# Patient Record
Sex: Male | Born: 1970
Health system: Southern US, Community
[De-identification: ages and names within clinical notes are randomized; demographics above are authoritative.]

## PROBLEM LIST (undated history)

## (undated) DIAGNOSIS — E7800 Pure hypercholesterolemia, unspecified: Secondary | ICD-10-CM

## (undated) DIAGNOSIS — T7840XA Allergy, unspecified, initial encounter: Secondary | ICD-10-CM

## (undated) DIAGNOSIS — R7303 Prediabetes: Secondary | ICD-10-CM

## (undated) DIAGNOSIS — J45909 Unspecified asthma, uncomplicated: Secondary | ICD-10-CM

## (undated) HISTORY — PX: EYE SURGERY: SHX253

## (undated) HISTORY — DX: Prediabetes: R73.03

## (undated) HISTORY — DX: Allergy, unspecified, initial encounter: T78.40XA

---

## 2016-08-14 ENCOUNTER — Emergency Department (HOSPITAL_BASED_OUTPATIENT_CLINIC_OR_DEPARTMENT_OTHER)
Admission: EM | Admit: 2016-08-14 | Discharge: 2016-08-14 | Disposition: A | Discharge status: Home / Self Care | Payer: Medicaid Other | Attending: Emergency Medicine | Admitting: Emergency Medicine

## 2016-08-14 ENCOUNTER — Encounter (HOSPITAL_BASED_OUTPATIENT_CLINIC_OR_DEPARTMENT_OTHER): Payer: Self-pay

## 2016-08-14 DIAGNOSIS — Y999 Unspecified external cause status: Secondary | ICD-10-CM | POA: Diagnosis not present

## 2016-08-14 DIAGNOSIS — Y939 Activity, unspecified: Secondary | ICD-10-CM | POA: Diagnosis not present

## 2016-08-14 DIAGNOSIS — S0502XA Injury of conjunctiva and corneal abrasion without foreign body, left eye, initial encounter: Secondary | ICD-10-CM | POA: Diagnosis not present

## 2016-08-14 DIAGNOSIS — Y929 Unspecified place or not applicable: Secondary | ICD-10-CM | POA: Insufficient documentation

## 2016-08-14 DIAGNOSIS — X58XXXA Exposure to other specified factors, initial encounter: Secondary | ICD-10-CM | POA: Insufficient documentation

## 2016-08-14 DIAGNOSIS — S0592XA Unspecified injury of left eye and orbit, initial encounter: Secondary | ICD-10-CM | POA: Diagnosis present

## 2016-08-14 MED ORDER — FLUORESCEIN SODIUM 1 MG OP STRP
ORAL_STRIP | OPHTHALMIC | Status: AC
  Administered 2016-08-14
  Filled 2016-08-14: qty 1

## 2016-08-14 MED ORDER — FLUORESCEIN SODIUM 1 MG OP STRP
1.0000 | ORAL_STRIP | Freq: Once | OPHTHALMIC | Status: AC
  Administered 2016-08-14: 1 via OPHTHALMIC

## 2016-08-14 MED ORDER — ERYTHROMYCIN 5 MG/GM OP OINT
TOPICAL_OINTMENT | Freq: Four times a day (QID) | OPHTHALMIC | Status: DC
  Administered 2016-08-14: 1 via OPHTHALMIC
  Filled 2016-08-14: qty 3.5

## 2016-08-14 MED ORDER — HYDROCODONE-ACETAMINOPHEN 5-325 MG PO TABS: 1.0000 | ORAL_TABLET | Freq: Four times a day (QID) | ORAL | 0 refills | Status: DC | PRN

## 2016-08-14 MED ORDER — TETRACAINE HCL 0.5 % OP SOLN
OPHTHALMIC | Status: AC
  Administered 2016-08-14
  Filled 2016-08-14: qty 4

## 2016-08-14 MED ORDER — HYDROCODONE-ACETAMINOPHEN 5-325 MG PO TABS
1.0000 | ORAL_TABLET | Freq: Once | ORAL | Status: AC
  Administered 2016-08-14: 1 via ORAL
  Filled 2016-08-14: qty 1

## 2016-08-14 MED ORDER — TETRACAINE HCL 0.5 % OP SOLN
2.0000 [drp] | Freq: Once | OPHTHALMIC | Status: AC
  Administered 2016-08-14: 2 [drp] via OPHTHALMIC

## 2016-08-14 NOTE — ED Provider Notes (Signed)
  [facility]   CSN: [id number] Arrival date & time: 08/14/16  2033  By signing my name below, [id number], Juan Willis, attest that this documentation has been prepared under the direction and in the presence of Juan LibraJohn Raju Coppolino, MD . Electronically Signed: Freida Busmaniana Willis, Scribe. 08/14/2016. 11:21 PM.  History   Chief Complaint Chief Complaint  Patient presents with  . Eye Problem    The history is provided by the patient. No language interpreter was used.     HPI Comments:  Juan Willis is a 45 y.o. male who presents to the Emergency Department complaining of left eye redness which began this AM. He reports associated burning pain and watering of the eye. He also notes mild vision change in the left eye. Pt believes he may have gotten something in his eye while cleaning but is unsure. He has used visine eye drops without relief.    History reviewed. No pertinent past medical history.  There are no active problems to display for this patient.   Past Surgical History:  Procedure Laterality Date  . EYE SURGERY       Home Medications    Prior to Admission medications   Medication Sig Start Date End Date Taking? Authorizing Provider  HYDROcodone-acetaminophen (NORCO) 5-325 MG tablet Take 1-2 tablets by mouth every 6 (six) hours as needed (for eye pain). 08/14/16   Juan LibraJohn Zachary Lovins, MD    Family History No family history on file.  Social History Social History  Substance Use Topics  . Smoking status: Never Smoker  . Smokeless tobacco: Never Used  . Alcohol use Yes     Comment: occ     Allergies   Review of patient's allergies indicates no known allergies.   Review of Systems Review of Systems 10 systems reviewed and all are negative for acute change except as noted in the HPI.   Physical Exam Updated Vital Signs BP 110/85 (BP Location: Right Arm)   Pulse 84   Temp 98.8 F (37.1 C) (Oral)   Resp 18   Ht 6\' 2"  (1.88 m)   Wt 180 lb (81.6 kg)    SpO2 99%   BMI 23.11 kg/m   Physical Exam General: Well-developed, well-nourished male in no acute distress; appearance consistent with age of record HENT: normocephalic; atraumatic Eyes: pupils equal, round and reactive to light; extraocular muscles intact; left conjunctival injection; small left corneal abrasion at ~11 o'clock  Neck: supple Heart: regular rate and rhythm Lungs: clear to auscultation bilaterally Abdomen: soft; nondistended; nontender; bowel sounds present Extremities: No deformity; full range of motion; pulses normal Neurologic: Awake, alert and oriented; motor function intact in all extremities and symmetric; no facial droop Skin: Warm and dry Psychiatric: Normal mood and affect   ED Treatments / Results  DIAGNOSTIC STUDIES:  Oxygen Saturation is 99% on RA, normal by my interpretation.    Procedures (including critical care time)   Final Clinical Impressions(s) / ED Diagnoses   Final diagnoses:  Corneal abrasion, left, initial encounter   I personally performed the services described in this documentation, which was scribed in my presence. The recorded information has been reviewed and is accurate.    Juan LibraJohn Laqueena Hinchey, MD 08/14/16 2329

## 2016-08-14 NOTE — ED Triage Notes (Signed)
Pt states he was dusting yesterday-no issues-this am after shower, redness/pain to left eye-NAD-steady gait

## 2016-10-11 ENCOUNTER — Ambulatory Visit (INDEPENDENT_AMBULATORY_CARE_PROVIDER_SITE_OTHER): Payer: Medicaid Other

## 2016-10-11 ENCOUNTER — Ambulatory Visit (HOSPITAL_COMMUNITY)
Admission: EM | Admit: 2016-10-11 | Discharge: 2016-10-11 | Disposition: A | Discharge status: Home / Self Care | Payer: Medicaid Other | Attending: Family Medicine | Admitting: Family Medicine

## 2016-10-11 ENCOUNTER — Encounter (HOSPITAL_COMMUNITY): Payer: Self-pay | Admitting: *Deleted

## 2016-10-11 DIAGNOSIS — M62838 Other muscle spasm: Secondary | ICD-10-CM

## 2016-10-11 MED ORDER — CYCLOBENZAPRINE HCL 5 MG PO TABS: 5.0000 mg | ORAL_TABLET | Freq: Every day | ORAL | 0 refills | Status: DC

## 2016-10-11 NOTE — ED Triage Notes (Signed)
Pt  Reports   Symptoms  Of  Neck  Pain   With  Onset     About  1  Year  Ago       Pt reports       Symptoms      Not  releived  By  ibuprophen        Pt  denys  Any  specefic  Injury

## 2016-10-11 NOTE — ED Provider Notes (Signed)
[facility]    CSN: [id number] Arrival date & time: 10/11/16  1253     History   Chief Complaint Chief Complaint  Patient presents with  . Neck Pain    HPI Juan Willis is a 45 y.o. male.   The history is provided by the patient.  Neck Pain  Pain location:  Occipital region Quality:  Shooting Pain radiates to:  Head Pain severity:  Mild Onset quality:  Gradual Duration:  12 months Progression:  Unchanged Chronicity:  Chronic Associated symptoms: headaches   Associated symptoms: no numbness, no paresis and no weakness     History reviewed. No pertinent past medical history.  There are no active problems to display for this patient.   Past Surgical History:  Procedure Laterality Date  . EYE SURGERY         Home Medications    Prior to Admission medications   Medication Sig Start Date End Date Taking? Authorizing Provider  HYDROcodone-acetaminophen (NORCO) 5-325 MG tablet Take 1-2 tablets by mouth every 6 (six) hours as needed (for eye pain). 08/14/16   Paula LibraJohn Molpus, MD    Family History History reviewed. No pertinent family history.  Social History Social History  Substance Use Topics  . Smoking status: Never Smoker  . Smokeless tobacco: Never Used  . Alcohol use Yes     Comment: occ     Allergies   Review of patient's allergies indicates no known allergies.   Review of Systems Review of Systems  Musculoskeletal: Positive for neck pain. Negative for neck stiffness.  Neurological: Positive for headaches. Negative for weakness and numbness.  Hematological: Negative for adenopathy.  All other systems reviewed and are negative.    Physical Exam Triage Vital Signs ED Triage Vitals [10/11/16 1313]  Enc Vitals Group     BP 120/64     Pulse Rate 72     Resp 14     Temp 97.6 F (36.4 C)     Temp Source Oral     SpO2 99 %     Weight      Height      Head Circumference      Peak Flow      Pain Score      Pain Loc      Pain  Edu?      Excl. in GC?    No data found.   Updated Vital Signs BP 120/64 (BP Location: Left Arm)   Pulse 72   Temp 97.6 F (36.4 C) (Oral)   Resp 14   SpO2 99%   Visual Acuity Right Eye Distance:   Left Eye Distance:   Bilateral Distance:    Right Eye Near:   Left Eye Near:    Bilateral Near:     Physical Exam  Constitutional: He is oriented to person, place, and time. He appears well-developed and well-nourished.  Neck: Normal range of motion. Neck supple.  Musculoskeletal: Normal range of motion. He exhibits no tenderness or deformity.  Lymphadenopathy:    He has no cervical adenopathy.  Neurological: He is alert and oriented to person, place, and time.  Skin: Skin is warm and dry.  Nursing note and vitals reviewed.    UC Treatments / Results  Labs (all labs ordered are listed, but only abnormal results are displayed) Labs Reviewed - No data to display  EKG  EKG Interpretation None       Radiology No results found. X-rays reviewed and report per radiologist.  Procedures Procedures (including critical care time)  Medications Ordered in UC Medications - No data to display   Initial Impression / Assessment and Plan / UC Course  I have reviewed the triage vital signs and the nursing notes.  Pertinent labs & imaging results that were available during my care of the patient were reviewed by me and considered in my medical decision making (see chart for details).  Clinical Course      Final Clinical Impressions(s) / UC Diagnoses   Final diagnoses:  None    New Prescriptions New Prescriptions   No medications on file     Linna Hoff, MD 10/11/16 1435

## 2017-04-10 ENCOUNTER — Encounter (HOSPITAL_BASED_OUTPATIENT_CLINIC_OR_DEPARTMENT_OTHER): Payer: Self-pay | Admitting: *Deleted

## 2017-04-10 ENCOUNTER — Emergency Department (HOSPITAL_BASED_OUTPATIENT_CLINIC_OR_DEPARTMENT_OTHER)
Admission: EM | Admit: 2017-04-10 | Discharge: 2017-04-10 | Disposition: A | Discharge status: Home / Self Care | Payer: Medicaid Other | Attending: Emergency Medicine | Admitting: Emergency Medicine

## 2017-04-10 DIAGNOSIS — M542 Cervicalgia: Secondary | ICD-10-CM | POA: Insufficient documentation

## 2017-04-10 MED ORDER — METHOCARBAMOL 500 MG PO TABS: 500.0000 mg | ORAL_TABLET | Freq: Two times a day (BID) | ORAL | 0 refills | Status: DC

## 2017-04-10 MED FILL — METHOCARBAMOL 500 MG TABLET: 500 | 10 days supply | Qty: 20 | Fill #0

## 2017-04-10 NOTE — ED Provider Notes (Signed)
[facility]   CSN: [id number] Arrival date & time: 04/10/17  4098     History   Chief Complaint Chief Complaint  Patient presents with  . Neck Pain    HPI Juan Willis is a 46 y.o. male.  Patient presents with a 14 month history of neck pain that began after he lifted a heavy object at home. He states that he has seen a specialist in [place] who gave him 2 steroid injections in his neck las year. He endorses relief with these but he states the pain has returned. He is here today because he is still having the same symptoms and ibuprofen 600 mg has not helped. He would like a referral to a specialist for more possible injections or imaging. Denies fevers, vomiting, rashes, trouble breathing, sick contacts, vision changes, recent injury.      History reviewed. No pertinent past medical history.  There are no active problems to display for this patient.   Past Surgical History:  Procedure Laterality Date  . EYE SURGERY         Home Medications    Prior to Admission medications   Medication Sig Start Date End Date Taking? Authorizing Provider  methocarbamol (ROBAXIN) 500 MG tablet Take 1 tablet (500 mg total) by mouth 2 (two) times daily. 04/10/17   Dietrich Pates, PA-C    Family History History reviewed. No pertinent family history.  Social History Social History  Substance Use Topics  . Smoking status: Never Smoker  . Smokeless tobacco: Never Used  . Alcohol use Yes     Comment: occ     Allergies   Patient has no known allergies.   Review of Systems Review of Systems  Constitutional: Negative for appetite change, chills and fever.  HENT: Negative for rhinorrhea and sore throat.   Eyes: Negative for photophobia and visual disturbance.  Respiratory: Negative for cough, chest tightness, shortness of breath and wheezing.   Cardiovascular: Negative for chest pain and palpitations.  Gastrointestinal: Negative for abdominal pain,  nausea and vomiting.  Musculoskeletal: Positive for neck pain. Negative for myalgias.  Skin: Negative for rash.  Neurological: Negative for dizziness, weakness and headaches.     Physical Exam Updated Vital Signs BP 118/76 (BP Location: Left Arm)   Pulse 93   Temp 98 F (36.7 C) (Oral)   Resp 16   Ht  (1.676 m)   Wt 73.9 kg   SpO2 100%   BMI 26.31 kg/m   Physical Exam  Constitutional: He appears well-developed and well-nourished. No distress.  HENT:  Head: Normocephalic and atraumatic.  Nose: Nose normal.  Eyes: Conjunctivae and EOM are normal. Right eye exhibits no discharge. Left eye exhibits no discharge. No scleral icterus.  Neck: Normal range of motion. Neck supple.  Patient has good active and passive range of motion of the neck. No step off noted. Negative Kernig and Brudzinski's signs.  Cardiovascular: Normal rate, regular rhythm, normal heart sounds and intact distal pulses.  Exam reveals no gallop and no friction rub.   No murmur heard. Pulmonary/Chest: Effort normal and breath sounds normal. No respiratory distress.  Abdominal: Soft. Bowel sounds are normal.  Musculoskeletal: Normal range of motion.  Lymphadenopathy:    He has no cervical adenopathy.  Neurological: He is alert. He exhibits normal muscle tone. Coordination normal.  Skin: Skin is warm and dry. No rash noted.  Psychiatric: He has a normal mood and affect.  Nursing note and vitals reviewed.  ED Treatments / Results  Labs (all labs ordered are listed, but only abnormal results are displayed) Labs Reviewed - No data to display  EKG  EKG Interpretation None       Radiology No results found.  Procedures Procedures (including critical care time)  Medications Ordered in ED Medications - No data to display   Initial Impression / Assessment and Plan / ED Course  I have reviewed the triage vital signs and the nursing notes.  Pertinent labs & imaging results that were available  during my care of the patient were reviewed by me and considered in my medical decision making (see chart for details).     Patient with a 14 month history of neck pain after lifting a heavy object. We will refer him to sports medicine and neurosurgery for possible imaging and injections as warranted. We'll also give him muscle relaxer to try because he has not tried this in the past. Return precautions given.  Final Clinical Impressions(s) / ED Diagnoses   Final diagnoses:  Neck pain    New Prescriptions New Prescriptions   METHOCARBAMOL (ROBAXIN) 500 MG TABLET    Take 1 tablet (500 mg total) by mouth 2 (two) times daily.     Dietrich Pates, PA-C 04/10/17 9528    Gwyneth Sprout, MD 04/10/17 [phone]

## 2017-04-10 NOTE — Discharge Instructions (Signed)
Begin taking Robaxin for muscle spasms. Scheduled appointment with Dr. Pearletha Forge in sports medicine or Dr. Conchita Paris with neurosurgery. Return to ED for worsening pain, fever, rash, vision changes, injuries, chest pain, trouble breathing.

## 2017-04-10 NOTE — ED Triage Notes (Signed)
Pt reports lifting a heavy object in June when he lived in [place] and had right shoulder pain. States he saw a doctor in [place] and had two cortisone injections which helped at the time but now his pain has returned. Pt states he has been taking motrin 800 but it is not helping. Pt requests referral to a "specialist."

## 2017-04-14 ENCOUNTER — Ambulatory Visit (INDEPENDENT_AMBULATORY_CARE_PROVIDER_SITE_OTHER): Payer: Medicaid Other | Admitting: Family Medicine

## 2017-04-14 ENCOUNTER — Encounter: Payer: Self-pay | Admitting: Family Medicine

## 2017-04-14 DIAGNOSIS — M542 Cervicalgia: Secondary | ICD-10-CM | POA: Diagnosis present

## 2017-04-14 MED ORDER — METHOCARBAMOL 500 MG PO TABS: 500.0000 mg | ORAL_TABLET | Freq: Three times a day (TID) | ORAL | 1 refills | Status: DC | PRN

## 2017-04-14 MED ORDER — MELOXICAM 15 MG PO TABS
15.0000 mg | ORAL_TABLET | Freq: Every day | ORAL | 2 refills | Status: DC
Start: 2017-04-14 — End: 2018-12-15

## 2017-04-14 MED FILL — METHOCARBAMOL 500 MG TABLET: 500 | 20 days supply | Qty: 60 | Fill #0

## 2017-04-14 MED FILL — MELOXICAM 15 MG TABLET: 15 | 30 days supply | Qty: 30 | Fill #0

## 2017-04-14 NOTE — Progress Notes (Signed)
PCP: No PCP Per Patient  Subjective:   HPI: Patient is a 46 y.o. male here for neck pain.  Patient reports a little over a year ago he recalled picking up and carrying something heavy on his left shoulder. No acute injury with this but the next day he could barely turn his neck. He was seen in [place] (where he lived at the time) and given ibuprofen, advised to change pillows. Has continued to have problems - went to an ED out there and given two injections (? In the neck) which helped a lot. Pain worsened again recently - went to ED downstairs and had x-rays that were normal, given robaxin which has helped him. Pain is at base of skull area now, neck pain feels better. Pain level 4/10, dull. No skin changes, numbness. No radiation into extremities. No bowel/bladder dysfunction.  No past medical history on file.  No current outpatient prescriptions on file prior to visit.   No current facility-administered medications on file prior to visit.     Past Surgical History:  Procedure Laterality Date  . EYE SURGERY      No Known Allergies  Social History   Social History  . Marital status: Married    Spouse name: N/A  . Number of children: N/A  . Years of education: N/A   Occupational History  . Not on file.   Social History Main Topics  . Smoking status: Never Smoker  . Smokeless tobacco: Never Used  . Alcohol use Yes     Comment: occ  . Drug use: No  . Sexual activity: Not on file   Other Topics Concern  . Not on file   Social History Narrative  . No narrative on file    No family history on file.  BP 116/81   Pulse 78   Ht  (1.676 m)   Wt 196 lb 9.6 oz (89.2 kg)   BMI 31.73 kg/m   Review of Systems: See HPI above.     Objective:  Physical Exam:  Gen: NAD, comfortable in exam room  Neck: No gross deformity, swelling, bruising. No TTP .  No midline/bony TTP. Extension only about 5 degrees, some pain .  FROM other motions without  pain. BUE strength 5/5.   Sensation intact to light touch.   1+ equal reflexes in triceps, biceps, brachioradialis tendons. Negative spurlings. NV intact distal BUEs.   Assessment & Plan:  1. Neck pain - consistent with cervical strain.  Independently reviewed radiographs and no abnormalities noted.  No evidence radiculopathy.  Start with meloxicam, robaxin if needed.  Handout provided with some home exercises and stretches to start.  Heat for spasms.  F/u in 1 month to 6 weeks.  Consider physical therapy, MRI depending on his status.

## 2017-04-14 NOTE — Patient Instructions (Signed)
You have a cervical strain at the base of your skull. Start meloxicam  daily with food. Take robaxin as needed as well for spasms. Consider physical therapy. See handout for stretches, exercises you can do daily. Heat as needed for spasms. Follow up with me in 1 month to 6 weeks. If not improving will consider an MRI.

## 2017-04-14 NOTE — Assessment & Plan Note (Signed)
consistent with cervical strain.  Independently reviewed radiographs and no abnormalities noted.  No evidence radiculopathy.  Start with meloxicam, robaxin if needed.  Handout provided with some home exercises and stretches to start.  Heat for spasms.  F/u in 1 month to 6 weeks.  Consider physical therapy, MRI depending on his status.

## 2017-05-21 ENCOUNTER — Ambulatory Visit: Payer: Medicaid Other | Admitting: Family Medicine

## 2017-05-27 ENCOUNTER — Ambulatory Visit: Payer: Medicaid Other | Admitting: Family Medicine

## 2017-12-24 ENCOUNTER — Ambulatory Visit: Payer: Self-pay | Admitting: Family Medicine

## 2018-05-30 ENCOUNTER — Emergency Department (HOSPITAL_BASED_OUTPATIENT_CLINIC_OR_DEPARTMENT_OTHER): Payer: Medicaid Other

## 2018-05-30 ENCOUNTER — Emergency Department (HOSPITAL_BASED_OUTPATIENT_CLINIC_OR_DEPARTMENT_OTHER)
Admission: EM | Admit: 2018-05-30 | Discharge: 2018-05-30 | Disposition: A | Discharge status: Home / Self Care | Payer: Medicaid Other | Attending: Emergency Medicine | Admitting: Emergency Medicine

## 2018-05-30 ENCOUNTER — Other Ambulatory Visit: Payer: Self-pay

## 2018-05-30 ENCOUNTER — Encounter (HOSPITAL_BASED_OUTPATIENT_CLINIC_OR_DEPARTMENT_OTHER): Payer: Self-pay | Admitting: Emergency Medicine

## 2018-05-30 DIAGNOSIS — R222 Localized swelling, mass and lump, trunk: Secondary | ICD-10-CM | POA: Insufficient documentation

## 2018-05-30 DIAGNOSIS — R0602 Shortness of breath: Secondary | ICD-10-CM | POA: Insufficient documentation

## 2018-05-30 DIAGNOSIS — Z79899 Other long term (current) drug therapy: Secondary | ICD-10-CM | POA: Diagnosis not present

## 2018-05-30 NOTE — ED Provider Notes (Signed)
[facility] EMERGENCY DEPARTMENT Provider Note   CSN: [id number] Arrival date & time: 05/30/18  2009     History   Chief Complaint Chief Complaint  Patient presents with  . bump in epigastric area    HPI Juan Willis is a 47 y.o. male.  Patient presents to the emergency department with swelling noted below the breastbone in the area of the xiphoid process.  He noticed this recently.  He was concerned about it and did not want to wait to try to get into a primary care doctor.  He denies any pain.  He states that sometimes he has difficulty breathing at night.  No fevers, nausea, vomiting.  No abdominal pain. The onset of this condition was acute. The course is constant. Aggravating factors: none. Alleviating factors: none.       History reviewed. No pertinent past medical history.  Patient Active Problem List   Diagnosis Date Noted  . Neck pain 04/14/2017    Past Surgical History:  Procedure Laterality Date  . EYE SURGERY          Home Medications    Prior to Admission medications   Medication Sig Start Date End Date Taking? Authorizing Provider  meloxicam (MOBIC) 15 MG tablet Take 1 tablet (15 mg total) by mouth daily. 04/14/17   Hudnall, Azucena FallenShane R, MD  methocarbamol (ROBAXIN) 500 MG tablet Take 1 tablet (500 mg total) by mouth every 8 (eight) hours as needed for muscle spasms. 04/14/17   Lenda KelpHudnall, Shane R, MD    Family History History reviewed. No pertinent family history.  Social History Social History   Tobacco Use  . Smoking status: Never Smoker  . Smokeless tobacco: Never Used  Substance Use Topics  . Alcohol use: Yes    Comment: occ  . Drug use: No     Allergies   Patient has no known allergies.   Review of Systems Review of Systems  Constitutional: Negative for chills and fever.  Respiratory: Positive for shortness of breath (at night, sometimes, no worse lately). Negative for cough.   Cardiovascular: Negative for chest pain.    Gastrointestinal: Negative for nausea and vomiting.     Physical Exam Updated Vital Signs BP (!) 141/91 (BP Location: Left Arm)   Pulse 78   Temp 98.6 F (37 C) (Oral)   Resp 18   Ht 5\' 6"  (1.676 m)   Wt 84 kg (185 lb 3 oz)   SpO2 98%   BMI 29.89 kg/m   Physical Exam  Constitutional: He appears well-developed and well-nourished.  HENT:  Head: Normocephalic and atraumatic.  Eyes: Conjunctivae are normal.  Neck: Normal range of motion. Neck supple.  Pulmonary/Chest: No respiratory distress. He exhibits no tenderness.  When patient pushes out his chest, there is a firm palpable visible nodule in the xiphoid area. No associated tenderness.   Neurological: He is alert.  Skin: Skin is warm and dry.  Psychiatric: He has a normal mood and affect.  Nursing note and vitals reviewed.    ED Treatments / Results  Labs (all labs ordered are listed, but only abnormal results are displayed) Labs Reviewed - No data to display  EKG None  Radiology No results found.  Procedures Procedures (including critical care time)  Medications Ordered in ED Medications - No data to display   Initial Impression / Assessment and Plan / ED Course  I have reviewed the triage vital signs and the nursing notes.  Pertinent labs & imaging results that  were available during my care of the patient were reviewed by me and considered in my medical decision making (see chart for details).     Patient seen and examined. X-ray ordered.   Vital signs reviewed and are as follows: BP (!) 141/91 (BP Location: Left Arm)   Pulse 78   Temp 98.6 F (37 C) (Oral)   Resp 18   Ht 5\' 6"  (1.676 m)   Wt 84 kg (185 lb 3 oz)   SpO2 98%   BMI 29.89 kg/m   Patient updated on results.  Bony skeleton appears unremarkable.  Patient has a natural outward deviation of his xiphoid process which may be why he is feeling a lump in this area.  Encourage follow-up with PCP as needed.  Final Clinical  Impressions(s) / ED Diagnoses   Final diagnoses:  Localized swelling of chest wall   No signs of soft tissue swelling or infection at area of swelling.  ED Discharge Orders    None       Renne Crigler, Cordelia Poche 05/30/18 2151    Maia Plan, MD 05/31/18 [phone]

## 2018-05-30 NOTE — ED Notes (Signed)
Pt points to xyphoid process and is concerned that it is protuberant. No abnormalities noted.

## 2018-05-30 NOTE — ED Triage Notes (Signed)
Patient states that he started to feel his lower sternal area today and he felt a bump - denies any pain

## 2018-05-30 NOTE — ED Notes (Signed)
No bump noted to epigastric area.  Wife stated that patient has difficulty breath at night and got awaken on his sleep.

## 2018-05-30 NOTE — Discharge Instructions (Signed)
Please read and follow all provided instructions.  Your diagnoses today include:  1. Localized swelling of chest wall     Tests performed today include:  Chest x-ray - is normal, your xiphoid process naturally turns out towards the skin and this may be why it feels more prominent.  Vital signs. See below for your results today.   Medications prescribed:   None  Take any prescribed medications only as directed.  Home care instructions:  Follow any educational materials contained in this packet.  BE VERY CAREFUL not to take multiple medicines containing Tylenol (also called acetaminophen). Doing so can lead to an overdose which can damage your liver and cause liver failure and possibly death.   Follow-up instructions: Please follow-up with your primary care provider as needed for further evaluation of your symptoms.   Return instructions:   Please return to the Emergency Department if you experience worsening symptoms.   Please return if you have any other emergent concerns.  Additional Information:  Your vital signs today were: BP (!) 141/91 (BP Location: Left Arm)    Pulse 78    Temp 98.6 F (37 C) (Oral)    Resp 18    Ht 5\' 6"  (1.676 m)    Wt 84 kg (185 lb 3 oz)    SpO2 98%    BMI 29.89 kg/m  If your blood pressure (BP) was elevated above 135/85 this visit, please have this repeated by your doctor within one month. --------------

## 2018-09-04 DIAGNOSIS — N401 Enlarged prostate with lower urinary tract symptoms: Secondary | ICD-10-CM | POA: Diagnosis not present

## 2018-09-04 DIAGNOSIS — R3912 Poor urinary stream: Secondary | ICD-10-CM | POA: Diagnosis not present

## 2018-09-04 DIAGNOSIS — R350 Frequency of micturition: Secondary | ICD-10-CM | POA: Diagnosis not present

## 2018-09-04 DIAGNOSIS — N403 Nodular prostate with lower urinary tract symptoms: Secondary | ICD-10-CM | POA: Diagnosis not present

## 2018-09-04 DIAGNOSIS — R3911 Hesitancy of micturition: Secondary | ICD-10-CM | POA: Diagnosis not present

## 2018-12-14 ENCOUNTER — Other Ambulatory Visit: Payer: Self-pay

## 2018-12-14 ENCOUNTER — Encounter (HOSPITAL_BASED_OUTPATIENT_CLINIC_OR_DEPARTMENT_OTHER): Payer: Self-pay | Admitting: Emergency Medicine

## 2018-12-14 ENCOUNTER — Emergency Department (HOSPITAL_BASED_OUTPATIENT_CLINIC_OR_DEPARTMENT_OTHER)
Admission: EM | Admit: 2018-12-14 | Discharge: 2018-12-15 | Disposition: A | Discharge status: Home / Self Care | Payer: Medicaid Other | Attending: Emergency Medicine | Admitting: Emergency Medicine

## 2018-12-14 DIAGNOSIS — Y9389 Activity, other specified: Secondary | ICD-10-CM | POA: Diagnosis not present

## 2018-12-14 DIAGNOSIS — T543X1A Toxic effect of corrosive alkalis and alkali-like substances, accidental (unintentional), initial encounter: Secondary | ICD-10-CM | POA: Insufficient documentation

## 2018-12-14 DIAGNOSIS — T2692XA Corrosion of left eye and adnexa, part unspecified, initial encounter: Secondary | ICD-10-CM | POA: Insufficient documentation

## 2018-12-14 DIAGNOSIS — S0592XA Unspecified injury of left eye and orbit, initial encounter: Secondary | ICD-10-CM | POA: Diagnosis present

## 2018-12-14 DIAGNOSIS — X58XXXA Exposure to other specified factors, initial encounter: Secondary | ICD-10-CM | POA: Insufficient documentation

## 2018-12-14 DIAGNOSIS — Y92009 Unspecified place in unspecified non-institutional (private) residence as the place of occurrence of the external cause: Secondary | ICD-10-CM | POA: Diagnosis not present

## 2018-12-14 DIAGNOSIS — Y998 Other external cause status: Secondary | ICD-10-CM | POA: Insufficient documentation

## 2018-12-14 MED ORDER — TETRACAINE HCL 0.5 % OP SOLN
2.0000 [drp] | Freq: Once | OPHTHALMIC | Status: AC
  Administered 2018-12-14: 2 [drp] via OPHTHALMIC
  Filled 2018-12-14: qty 4

## 2018-12-14 NOTE — ED Notes (Signed)
pH L eye 8.0 R eye 7.0

## 2018-12-14 NOTE — ED Triage Notes (Signed)
Pt states he had a clogged drain so he put some chemical and hot water in the drain then went downstairs and loosened the pipe and the water from the pipe got in his right eye  Pt states he flushed his eye and used eye drops

## 2018-12-15 MED ORDER — CIPROFLOXACIN HCL 0.3 % OP SOLN
2.0000 [drp] | OPHTHALMIC | Status: DC
  Administered 2018-12-15: 2 [drp] via OPHTHALMIC
  Filled 2018-12-15: qty 2.5

## 2018-12-15 MED ORDER — FLUORESCEIN SODIUM 1 MG OP STRP
1.0000 | ORAL_STRIP | Freq: Once | OPHTHALMIC | Status: AC
  Administered 2018-12-15: 1 via OPHTHALMIC
  Filled 2018-12-15: qty 1

## 2018-12-15 NOTE — ED Provider Notes (Signed)
[facility] Provider Note: Lowella DellJ. Lane Shantinique Picazo, MD, FACEP  CSN: [id number] MRN: [id number] ARRIVAL: 12/14/18 at 2257 ROOM: [facility]   CHIEF COMPLAINT  Eye Problem   HISTORY OF PRESENT ILLNESS  12/15/18 12:13 AM Juan Willis is a 47 y.o. male who was cleaning out a drain about 2 PM yesterday afternoon.  He got some drain cleaner in hot water sprayed in his left eye.  He flushed his left eye with water and use some Visine.  He presents with redness and pain in his left eye.  He rates his pain about a 5 out of 10, burning in nature.  His eyes were irrigated by nursing staff prior to my evaluation.  The vision in his left eye was blurred earlier but has improved.   History reviewed. No pertinent past medical history.  Past Surgical History:  Procedure Laterality Date  . EYE SURGERY      History reviewed. No pertinent family history.  Social History   Tobacco Use  . Smoking status: Never Smoker  . Smokeless tobacco: Never Used  Substance Use Topics  . Alcohol use: Yes    Comment: occ  . Drug use: No    Prior to Admission medications   Medication Sig Start Date End Date Taking? Authorizing Provider  meloxicam (MOBIC) 15 MG tablet Take 1 tablet (15 mg total) by mouth daily. 04/14/17   Hudnall, Azucena FallenShane R, MD  methocarbamol (ROBAXIN) 500 MG tablet Take 1 tablet (500 mg total) by mouth every 8 (eight) hours as needed for muscle spasms. 04/14/17   Lenda KelpHudnall, Shane R, MD    Allergies Patient has no known allergies.   REVIEW OF SYSTEMS  Negative except as noted here or in the History of Present Illness.   PHYSICAL EXAMINATION  Initial Vital Signs Blood pressure 132/86, pulse 86, temperature 97.6 F (36.4 C), temperature source Oral, resp. rate 16, height 5\' 7"  (1.702 m), weight 87.5 kg, SpO2 97 %.  Examination General: Well-developed, well-nourished male in no acute distress; appearance consistent with age of record HENT: normocephalic; mild left periorbital  erythema Eyes: pupils equal, round and reactive to light; extraocular muscles intact; left conjunctival erythema with slight mucoid discharge; no fluorescein uptake; left eye pH 7 Neck: supple Heart: regular rate and rhythm Lungs: clear to auscultation bilaterally Abdomen: soft; nondistended; nontender; bowel sounds present Extremities: No deformity; full range of motion Neurologic: Awake, alert and oriented; motor function intact in all extremities and symmetric; no facial droop Skin: Warm and dry Psychiatric: Normal mood and affect   RESULTS  Summary of this visit's results, reviewed by myself:   EKG Interpretation  Date/Time:    Ventricular Rate:    PR Interval:    QRS Duration:   QT Interval:    QTC Calculation:   R Axis:     Text Interpretation:        Laboratory Studies: No results found for this or any previous visit (from the past 24 hour(s)). Imaging Studies: No results found.  ED COURSE and MDM  Nursing notes and initial vitals signs, including pulse oximetry, reviewed.  Vitals:   12/14/18 2310 12/14/18 2312  BP: 132/86   Pulse: 86   Resp: 16   Temp: 97.6 F (36.4 C)   TempSrc: Oral   SpO2: 97%   Weight:  87.5 kg  Height:  5\' 7"  (1.702 m)   Will treat with topical antibiotic drops and refer to ophthalmology.  No corneal burns seen.   PROCEDURES  ED DIAGNOSES     ICD-10-CM   1. Chemical injury of left eye, initial encounter T26.92XA        Raye Slyter, Jonny RuizJohn, MD 12/15/18 [phone]

## 2018-12-16 DIAGNOSIS — T2692XA Corrosion of left eye and adnexa, part unspecified, initial encounter: Secondary | ICD-10-CM | POA: Diagnosis not present

## 2018-12-21 DIAGNOSIS — T2660XS Corrosion of cornea and conjunctival sac, unspecified eye, sequela: Secondary | ICD-10-CM | POA: Diagnosis not present

## 2019-03-19 ENCOUNTER — Other Ambulatory Visit: Payer: Self-pay

## 2019-03-19 ENCOUNTER — Emergency Department (HOSPITAL_BASED_OUTPATIENT_CLINIC_OR_DEPARTMENT_OTHER): Payer: Medicaid Other

## 2019-03-19 ENCOUNTER — Ambulatory Visit: Payer: Self-pay

## 2019-03-19 ENCOUNTER — Emergency Department (HOSPITAL_BASED_OUTPATIENT_CLINIC_OR_DEPARTMENT_OTHER)
Admission: EM | Admit: 2019-03-19 | Discharge: 2019-03-19 | Disposition: A | Discharge status: Home / Self Care | Payer: Medicaid Other | Attending: Emergency Medicine | Admitting: Emergency Medicine

## 2019-03-19 ENCOUNTER — Encounter (HOSPITAL_BASED_OUTPATIENT_CLINIC_OR_DEPARTMENT_OTHER): Payer: Self-pay | Admitting: Adult Health

## 2019-03-19 DIAGNOSIS — J45909 Unspecified asthma, uncomplicated: Secondary | ICD-10-CM | POA: Diagnosis not present

## 2019-03-19 DIAGNOSIS — R739 Hyperglycemia, unspecified: Secondary | ICD-10-CM

## 2019-03-19 DIAGNOSIS — R0602 Shortness of breath: Secondary | ICD-10-CM | POA: Diagnosis not present

## 2019-03-19 DIAGNOSIS — R0600 Dyspnea, unspecified: Secondary | ICD-10-CM | POA: Diagnosis present

## 2019-03-19 HISTORY — DX: Unspecified asthma, uncomplicated: J45.909

## 2019-03-19 LAB — CBC
HCT: 47.4 % (ref 39.0–52.0)
Hemoglobin: 16.3 g/dL (ref 13.0–17.0)
MCH: 29.3 pg (ref 26.0–34.0)
MCHC: 34.4 g/dL (ref 30.0–36.0)
MCV: 85.1 fL (ref 80.0–100.0)
NRBC: 0 % (ref 0.0–0.2)
Platelets: 198 10*3/uL (ref 150–400)
RBC: 5.57 MIL/uL (ref 4.22–5.81)
RDW: 12.7 % (ref 11.5–15.5)
WBC: 8.5 10*3/uL (ref 4.0–10.5)

## 2019-03-19 LAB — BASIC METABOLIC PANEL
Anion gap: 8 (ref 5–15)
BUN: 20 mg/dL (ref 6–20)
CALCIUM: 9.2 mg/dL (ref 8.9–10.3)
CHLORIDE: 104 mmol/L (ref 98–111)
CO2: 24 mmol/L (ref 22–32)
CREATININE: 0.82 mg/dL (ref 0.61–1.24)
GFR calc Af Amer: >60 mL/min (ref >60)
GFR calc non Af Amer: >60 mL/min (ref >60)
GLUCOSE: 205 mg/dL — AB (ref 70–99)
Potassium: 3.4 mmol/L — ABNORMAL LOW (ref 3.5–5.1)
Sodium: 136 mmol/L (ref 135–145)

## 2019-03-19 LAB — D-DIMER, QUANTITATIVE (NOT AT ARMC)

## 2019-03-19 MED ORDER — ALUM & MAG HYDROXIDE-SIMETH 200-200-20 MG/5ML PO SUSP
30.0000 mL | Freq: Once | ORAL | Status: AC
  Administered 2019-03-19: 30 mL via ORAL
  Filled 2019-03-19: qty 30

## 2019-03-19 MED ORDER — OMEPRAZOLE 20 MG PO CPDR: 20.0000 mg | DELAYED_RELEASE_CAPSULE | Freq: Every day | ORAL | 0 refills | Status: DC

## 2019-03-19 MED ORDER — SODIUM CHLORIDE 0.9 % IV BOLUS
1000.0000 mL | Freq: Once | INTRAVENOUS | Status: AC
  Administered 2019-03-19: 1000 mL via INTRAVENOUS

## 2019-03-19 NOTE — ED Provider Notes (Signed)
[facility] EMERGENCY DEPARTMENT Provider Note   CSN: [id number] Arrival date & time: 03/19/19  1524    History   Chief Complaint Chief Complaint  Patient presents with  . Shortness of Breath    HPI Juan Willis is a 48 y.o. male with a hx of asthma who presents to the ED with complaints of dyspnea that began last night. Patient states he was sitting at rest when he began to feel short of breath last night described as sensation of not getting enough air. He has somewhat of a globus sensation associated with this. Sxs resolved last night, he was asymptomatic throughout the morning today and then sxs returned 2-3 hours PTA. Somewhat aggravated by stress/anxiety, alleviated by trying to calm down, otherwise no alleviating/aggravating factors. Tried his inhaler without much change, does not feel like asthma issue. Denies fever, chills, cough, chest pain, lightheadedness, dizziness, or syncope. Denies sore throat or trouble swallowing. Denies leg pain/swelling, hemoptysis, recent surgery/trauma, recent long travel, hormone use, personal hx of cancer, or hx of DVT/PE. No recent travel or known covid 19 exposure. No new food/environment/product exposures, no facial swelling or rash.       HPI  Past Medical History:  Diagnosis Date  . Asthma     Patient Active Problem List   Diagnosis Date Noted  . Neck pain 04/14/2017    Past Surgical History:  Procedure Laterality Date  . EYE SURGERY          Home Medications    Prior to Admission medications   Not on File    Family History History reviewed. No pertinent family history.  Social History Social History   Tobacco Use  . Smoking status: Never Smoker  . Smokeless tobacco: Never Used  Substance Use Topics  . Alcohol use: Yes    Comment: occ  . Drug use: No     Allergies   Patient has no known allergies.   Review of Systems Review of Systems  Constitutional: Negative for chills and fever.  HENT:  Negative for congestion, ear pain, sore throat and trouble swallowing.   Respiratory: Positive for shortness of breath. Negative for cough.   Cardiovascular: Negative for chest pain.  Gastrointestinal: Negative for abdominal pain, nausea and vomiting.  Skin: Negative for rash.  Neurological: Negative for syncope.  Psychiatric/Behavioral: The patient is nervous/anxious.   All other systems reviewed and are negative.   Physical Exam Updated Vital Signs BP (!) 148/121   Pulse (!) 108   Temp 98.8 F (37.1 C) (Oral)   Resp (!) 22   Ht  (1.676 m)   SpO2 98%   BMI 31.15 kg/m   Physical Exam Vitals signs and nursing note reviewed.  Constitutional:      General: He is not in acute distress.    Appearance: He is well-developed. He is not toxic-appearing.  HENT:     Head: Normocephalic and atraumatic.     Comments: No angioedema noted.     Right Ear: Tympanic membrane normal.     Left Ear: Tympanic membrane normal.     Nose: Nose normal.     Mouth/Throat:     Mouth: Mucous membranes are moist.     Pharynx: Uvula midline. Posterior oropharyngeal erythema (mild) present.     Tonsils: No tonsillar exudate. 1+ on the right. 1+ on the left.     Comments: Posterior oropharynx is symmetric appearing. Patient tolerating own secretions without difficulty. No trismus. No drooling. No hot potato voice.  No swelling beneath the tongue, submandibular compartment is soft.  Eyes:     General:        Right eye: No discharge.        Left eye: No discharge.     Extraocular Movements: Extraocular movements intact.     Conjunctiva/sclera: Conjunctivae normal.     Pupils: Pupils are equal, round, and reactive to light.  Neck:     Musculoskeletal: Normal range of motion and neck supple. No edema, neck rigidity or crepitus.  Cardiovascular:     Rate and Rhythm: Regular rhythm. Tachycardia present.  Pulmonary:     Effort: Pulmonary effort is normal. No respiratory distress.     Breath sounds:  Normal breath sounds. No wheezing, rhonchi or rales.  Abdominal:     General: There is no distension.     Palpations: Abdomen is soft.     Tenderness: There is no abdominal tenderness.  Skin:    General: Skin is warm and dry.     Findings: No rash.  Neurological:     Mental Status: He is alert.     Comments: Clear speech.   Psychiatric:        Behavior: Behavior normal.    ED Treatments / Results  Labs (all labs ordered are listed, but only abnormal results are displayed) Labs Reviewed  BASIC METABOLIC PANEL - Abnormal; Notable for the following components:      Result Value   Potassium 3.4 (*)    Glucose, Bld 205 (*)    All other components within normal limits  D-DIMER, QUANTITATIVE (NOT AT [facility])  CBC    EKG None  Radiology Dg Neck Soft Tissue  Result Date: 03/19/2019 CLINICAL DATA:  Worsening shortness of breath, mild cough, history asthma EXAM: NECK SOFT TISSUES - 1+ VIEW COMPARISON:  Cervical spine radiographs 10/11/2016 FINDINGS: Prevertebral soft tissues normal thickness. Epiglottis and aryepiglottic folds normal thickness. Airway patent. Osseous structures grossly unremarkable. IMPRESSION: No acute abnormalities. Electronically Signed   By: Ulyses Southward M.D.   On: 03/19/2019 16:38   Dg Chest 2 View  Result Date: 03/19/2019 CLINICAL DATA:  dyspnea EXAM: CHEST - 2 VIEW COMPARISON:  05/30/2018 FINDINGS: There are bilateral chronic bronchitic changes. There is no focal consolidation. There is no pleural effusion or pneumothorax. The heart and mediastinal contours are unremarkable. The osseous structures are unremarkable. IMPRESSION: No active cardiopulmonary disease. Electronically Signed   By: Elige Ko   On: 03/19/2019 16:42    Procedures Procedures (including critical care time)  Medications Ordered in ED Medications - No data to display   Initial Impression / Assessment and Plan / ED Course  I have reviewed the triage vital signs and the nursing notes.   Pertinent labs & imaging results that were available during my care of the patient were reviewed by me and considered in my medical decision making (see chart for details).   Patient presents to the ED with complaint of dyspnea as well as globus sensation to an extent. Nontoxic appearing, no apparent distress, mild tachycardia noted, BP elevated- doubt HTN emergency, afebrile. On exam patient does not appear to be in respiratory distress. He is not tachypnic on my assessment despite nursing documentation. He has no trismus, drooling, or hot potato voice, he is tolerating his own secretions without difficulty. No evidence of angioedema- does not seem like anaphylaxis. Lungs are clear to auscultation bilaterally. Will evaluate w/ x-rays of neck soft tissue & chest to assess airway. On monitor. EKG per triage  with sinus tachycardia.   CXR: Negative. No infiltrate, pneumothorax, edema, effusion, or airway obstruction.  DG soft tissue neck: Negative. Airway is patent. Epiglottis appears or normal thickness.   Patient remains tachycardic will obtain basic labs & d-dimer and administer fluids.   CBC: no anemia or leukocytosis.  BMP: No significant electrolyte derangement, mild hypokalemia @ 3.4- diet replacement.  D-dimer negative- low risk wells- doubt PE.   Ambulatory w/ SpO2 remaining @ 98-100%. Tachycardia normalized. Patient tolerating PO in the emergency department. Appears safe for discharge at this time w/ overall reassuring work up and well appearance. Will trial PPI for globus sensation. PCP follow up. I discussed results, treatment plan, need for follow-up, and return precautions with the patient. Provided opportunity for questions, patient confirmed understanding and is in agreement with plan.    Vitals:   03/19/19 1800 03/19/19 1830  BP: 108/76 114/73  Pulse: 71 89  Resp: 14 18  Temp:    SpO2: 97% 100%    Findings and plan of care discussed with supervising physician Dr. Patria Mane who is  in agreement.   Final Clinical Impressions(s) / ED Diagnoses   Final diagnoses:  SOB (shortness of breath)  Hyperglycemia    ED Discharge Orders         Ordered    omeprazole (PRILOSEC) 20 MG capsule  Daily     03/19/19 1845           Desmond Lope 03/19/19 1906    Azalia Bilis, MD 03/19/19 2207

## 2019-03-19 NOTE — ED Notes (Signed)
C/o sob and throat tightness , diff taking deep breath.  Hx asthma  States works outside [employer]

## 2019-03-19 NOTE — Telephone Encounter (Signed)
Patient called and says he's driving to the ED in [facility] and says he's having trouble breathing, the inhaler he has is not working. He says he's been like this a couple of days and wonders if they can test him for the coronavirus. I advised to continue on to the ED, they will assess his breathing situation, treat him and determine if the testing is needed, he verbalized understanding.

## 2019-03-19 NOTE — Discharge Instructions (Addendum)
You were seen in the ER for difficulty breathing Your xrays were normal Your labs did not show any major concerning findings. Your blood sugar was high in the 200s- have this rechecked by a primary care provider Your potassium was slightly low at 3.4- eat foods rich in potassium per attached guidelines.    We would like you to try omeprazole to treat the sensation you have in your throat. Take this medicine daily.   We have prescribed you new medication(s) today. Discuss the medications prescribed today with your pharmacist as they can have adverse effects and interactions with your other medicines including over the counter and prescribed medications. Seek medical evaluation if you start to experience new or abnormal symptoms after taking one of these medicines, seek care immediately if you start to experience difficulty breathing, feeling of your throat closing, facial swelling, or rash as these could be indications of a more serious allergic reaction   Follow up with primary care in 3 days. If you do not have a primary care please call the circled phone number or follow up with the [place] clinic. Return to the ER for new or worsening symptoms including but not limited to worsened trouble breathing, pain, being unable to swallow, drooling, throat pain, fever, or any other concerns.

## 2019-03-19 NOTE — ED Triage Notes (Signed)
PResents with SOB that began last night and he was unable to sleep . It became worse today one hour ago. He reports that he feels like he is being choked. He used an inhaler with no relief. Sats 98%, denies chest pain and cough. He is a non smoker.

## 2020-01-13 ENCOUNTER — Encounter (HOSPITAL_BASED_OUTPATIENT_CLINIC_OR_DEPARTMENT_OTHER): Payer: Self-pay | Admitting: Emergency Medicine

## 2020-01-13 ENCOUNTER — Emergency Department (HOSPITAL_BASED_OUTPATIENT_CLINIC_OR_DEPARTMENT_OTHER)
Admission: EM | Admit: 2020-01-13 | Discharge: 2020-01-13 | Disposition: A | Discharge status: Home / Self Care | Payer: Medicaid Other | Attending: Emergency Medicine | Admitting: Emergency Medicine

## 2020-01-13 ENCOUNTER — Other Ambulatory Visit: Payer: Self-pay

## 2020-01-13 DIAGNOSIS — R42 Dizziness and giddiness: Secondary | ICD-10-CM | POA: Insufficient documentation

## 2020-01-13 DIAGNOSIS — R112 Nausea with vomiting, unspecified: Secondary | ICD-10-CM | POA: Diagnosis not present

## 2020-01-13 LAB — CBC
HCT: 49.1 % (ref 39.0–52.0)
Hemoglobin: 16.5 g/dL (ref 13.0–17.0)
MCH: 29.5 pg (ref 26.0–34.0)
MCHC: 33.6 g/dL (ref 30.0–36.0)
MCV: 87.7 fL (ref 80.0–100.0)
Platelets: 235 10*3/uL (ref 150–400)
RBC: 5.6 MIL/uL (ref 4.22–5.81)
RDW: 13 % (ref 11.5–15.5)
WBC: 9.9 10*3/uL (ref 4.0–10.5)
nRBC: 0 % (ref 0.0–0.2)

## 2020-01-13 LAB — BASIC METABOLIC PANEL
Anion gap: 9 (ref 5–15)
BUN: 26 mg/dL — ABNORMAL HIGH (ref 6–20)
CO2: 24 mmol/L (ref 22–32)
Calcium: 9.2 mg/dL (ref 8.9–10.3)
Chloride: 106 mmol/L (ref 98–111)
Creatinine, Ser: 0.87 mg/dL (ref 0.61–1.24)
GFR calc Af Amer: >60 mL/min (ref >60)
GFR calc non Af Amer: >60 mL/min (ref >60)
Glucose, Bld: 143 mg/dL — ABNORMAL HIGH (ref 70–99)
Potassium: 3.8 mmol/L (ref 3.5–5.1)
Sodium: 139 mmol/L (ref 135–145)

## 2020-01-13 MED ORDER — ONDANSETRON HCL 4 MG/2ML IJ SOLN
INTRAMUSCULAR | Status: AC
  Administered 2020-01-13: 4 mg via INTRAVENOUS
  Filled 2020-01-13: qty 2

## 2020-01-13 MED ORDER — ONDANSETRON HCL 4 MG/2ML IJ SOLN: 4.0000 mg | Freq: Once | INTRAMUSCULAR | Status: AC | PRN

## 2020-01-13 MED ORDER — MECLIZINE HCL 25 MG PO TABS
50.0000 mg | ORAL_TABLET | Freq: Once | ORAL | Status: AC
  Administered 2020-01-13: 50 mg via ORAL
  Filled 2020-01-13: qty 2

## 2020-01-13 MED ORDER — MECLIZINE HCL 50 MG PO TABS: 50.0000 mg | ORAL_TABLET | Freq: Three times a day (TID) | ORAL | 0 refills | Status: DC | PRN

## 2020-01-13 MED ORDER — ONDANSETRON 8 MG PO TBDP: 8.0000 mg | ORAL_TABLET | Freq: Three times a day (TID) | ORAL | 0 refills | Status: DC | PRN

## 2020-01-13 MED FILL — ONDANSETRON ODT 8 MG TABLET: 8 | 4 days supply | Qty: 12 | Fill #0

## 2020-01-13 MED FILL — MECLIZINE 25 MG TABLET: 25 | 5 days supply | Qty: 30 | Fill #0

## 2020-01-13 NOTE — ED Triage Notes (Signed)
Pt reports new onset of dizziness, nausea. Emesis. denies Hx vertigo. No headache, no further neuro deficit.

## 2020-01-13 NOTE — ED Provider Notes (Signed)
[place] EMERGENCY DEPARTMENT Provider Note   CSN: [id number] Arrival date & time: 01/13/20  2706     History Chief Complaint  Patient presents with  . Dizziness    Juan Willis is a 49 y.o. male.  HPI   Patient presents to the emergency room for evaluation of dizziness.  Patient states the symptoms started this morning.  He noticed the room was spinning.  He felt nauseated and vomited.  Symptoms worsen with any movement and improves when he is lying still.  He denies any earache.  No fevers or chills.  No headache.  No trouble with his vision or speech.  Patient denies any previous history of similar symptoms.  Past Medical History:  Diagnosis Date  . Asthma     Patient Active Problem List   Diagnosis Date Noted  . Neck pain 04/14/2017    Past Surgical History:  Procedure Laterality Date  . EYE SURGERY         No family history on file.  Social History   Tobacco Use  . Smoking status: Never Smoker  . Smokeless tobacco: Never Used  Substance Use Topics  . Alcohol use: Yes    Comment: occ  . Drug use: No    Home Medications Prior to Admission medications   Medication Sig Start Date End Date Taking? Authorizing Provider  meclizine (ANTIVERT) 50 MG tablet Take 1 tablet (50 mg total) by mouth 3 (three) times daily as needed for dizziness. 01/13/20   Dorie Rank, MD  omeprazole (PRILOSEC) 20 MG capsule Take 1 capsule (20 mg total) by mouth daily. 03/19/19   Petrucelli, Samantha R, PA-C  ondansetron (ZOFRAN ODT) 8 MG disintegrating tablet Take 1 tablet (8 mg total) by mouth every 8 (eight) hours as needed for nausea or vomiting. 01/13/20   Dorie Rank, MD    Allergies    Patient has no known allergies.  Review of Systems   Review of Systems  All other systems reviewed and are negative.   Physical Exam Updated Vital Signs There were no vitals taken for this visit.  Physical Exam Vitals and nursing note reviewed.  Constitutional:      General:  He is not in acute distress.    Appearance: He is well-developed.  HENT:     Head: Normocephalic and atraumatic.     Right Ear: External ear normal.     Left Ear: External ear normal.  Eyes:     General: No scleral icterus.       Right eye: No discharge.        Left eye: No discharge.     Conjunctiva/sclera: Conjunctivae normal.  Neck:     Trachea: No tracheal deviation.  Cardiovascular:     Rate and Rhythm: Normal rate and regular rhythm.  Pulmonary:     Effort: Pulmonary effort is normal. No respiratory distress.     Breath sounds: Normal breath sounds. No stridor. No wheezing or rales.  Abdominal:     General: Bowel sounds are normal. There is no distension.     Palpations: Abdomen is soft.     Tenderness: There is no abdominal tenderness. There is no guarding or rebound.  Musculoskeletal:        General: No tenderness.     Cervical back: Neck supple.  Skin:    General: Skin is warm and dry.     Findings: No rash.  Neurological:     Mental Status: He is alert and oriented to person,  place, and time.     Cranial Nerves: No cranial nerve deficit (No facial droop, extraocular movements intact, tongue midline ).     Sensory: No sensory deficit.     Motor: No abnormal muscle tone or seizure activity.     Coordination: Coordination normal.     Comments: No pronator drift bilateral upper extrem, able to hold both legs off bed for 5 seconds, sensation intact in all extremities, no visual field cuts, no left or right sided neglect, normal finger-nose exam bilaterally, no nystagmus noted      ED Results / Procedures / Treatments   Labs (all labs ordered are listed, but only abnormal results are displayed) Labs Reviewed  BASIC METABOLIC PANEL - Abnormal; Notable for the following components:      Result Value   Glucose, Bld 143 (*)    BUN 26 (*)    All other components within normal limits  CBC    EKG EKG Interpretation  Date/Time:  Thursday January 13 2020 08:31:24  EST Ventricular Rate:  75 PR Interval:    QRS Duration: 95 QT Interval:  389 QTC Calculation: 435 R Axis:   36 Text Interpretation: Sinus rhythm Abnormal R-wave progression, early transition nonspecific t wave changes resolved since last tracing Confirmed by Linwood Dibbles [id number]) on 01/13/2020 8:39:22 AM   Radiology No results found.  Procedures Procedures (including critical care time)  Medications Ordered in ED Medications  ondansetron (ZOFRAN) injection 4 mg (4 mg Intravenous Given 01/13/20 0838)  meclizine (ANTIVERT) tablet 50 mg (50 mg Oral Given 01/13/20 6045)    ED Course  I have reviewed the triage vital signs and the nursing notes.  Pertinent labs & imaging results that were available during my care of the patient were reviewed by me and considered in my medical decision making (see chart for details).  Clinical Course as of Jan 13 1004  Thu Jan 13, 2020  0940 Labs reviewed.  No significant abnormalities.  Hyperglycemia noted but I do not think this is clinically significant   [JK]    Clinical Course User Index [JK] Linwood Dibbles, MD   MDM Rules/Calculators/A&P               NIH Stroke Scale: 0      Patient presented with complaints of dizziness.  Symptoms are suggestive of peripheral vertigo.  I doubt stroke or TIA.  Patient does not have any focal neurologic symptoms.  Patient was given a dose of meclizine and Zofran.  He had symptomatic relief.  Plan on discharge home with oral medications.  Discussed Epley maneuvers.  Return to ED for worsening symptoms     Final Clinical Impression(s) / ED Diagnoses Final diagnoses:  Vertigo    Rx / DC Orders ED Discharge Orders         Ordered    meclizine (ANTIVERT) 50 MG tablet  3 times daily PRN     01/13/20 1004    ondansetron (ZOFRAN ODT) 8 MG disintegrating tablet  Every 8 hours PRN     01/13/20 1004           Linwood Dibbles, MD 01/13/20 1006

## 2020-01-13 NOTE — Discharge Instructions (Addendum)
Try doing the Epley maneuvers to help with your dizziness.  The meclizine medication can also help.  Zofran you can take as needed for nausea.  Please review the discharge instructions for additional information.   Follow up with a primary care doctor routinely, your blood sugar was slightly elevated today

## 2020-02-07 ENCOUNTER — Ambulatory Visit: Payer: Medicaid Other | Admitting: Internal Medicine

## 2020-02-09 ENCOUNTER — Other Ambulatory Visit: Payer: Self-pay

## 2020-02-09 ENCOUNTER — Ambulatory Visit: Payer: Medicaid Other

## 2020-02-09 ENCOUNTER — Ambulatory Visit: Payer: Medicaid Other | Attending: Nurse Practitioner | Admitting: Nurse Practitioner

## 2020-02-09 ENCOUNTER — Encounter: Payer: Self-pay | Admitting: Nurse Practitioner

## 2020-02-09 DIAGNOSIS — R42 Dizziness and giddiness: Secondary | ICD-10-CM | POA: Diagnosis not present

## 2020-02-09 DIAGNOSIS — Z7689 Persons encountering health services in other specified circumstances: Secondary | ICD-10-CM | POA: Diagnosis not present

## 2020-02-09 DIAGNOSIS — R5383 Other fatigue: Secondary | ICD-10-CM | POA: Insufficient documentation

## 2020-02-09 DIAGNOSIS — J45909 Unspecified asthma, uncomplicated: Secondary | ICD-10-CM | POA: Diagnosis not present

## 2020-02-09 DIAGNOSIS — Z13220 Encounter for screening for lipoid disorders: Secondary | ICD-10-CM | POA: Diagnosis not present

## 2020-02-09 DIAGNOSIS — Z131 Encounter for screening for diabetes mellitus: Secondary | ICD-10-CM | POA: Diagnosis not present

## 2020-02-09 NOTE — Progress Notes (Signed)
Virtual Visit via Telephone Note Due to national recommendations of social distancing due to Juan Willis, telehealth visit is felt to be most appropriate for this patient at this time.  I discussed the limitations, risks, security and privacy concerns of performing an evaluation and management service by telephone and the availability of in person appointments. I also discussed with the patient that there may be a patient responsible charge related to this service. The patient expressed understanding and agreed to proceed.    I connected with Juan Willis on 02/09/20  at   9:30 AM EST  EDT by telephone and verified that I am speaking with the correct person using two identifiers.   Consent I discussed the limitations, risks, security and privacy concerns of performing an evaluation and management service by telephone and the availability of in person appointments. I also discussed with the patient that there may be a patient responsible charge related to this service. The patient expressed understanding and agreed to proceed.   Location of Patient: Private  Residence    Location of Provider: [place] and [place] participating in Telemedicine visit: Juan Rankins FNP-BC [place]    History of Present Illness: Telemedicine visit for: Establish Care  has a past medical history of Asthma.   Has concerns of dizziness and lack of exercise. He was recently evaluated in the ED on 01-13-2020 with complaints of dizziness.  He was prescribed meclizine and zofran for presumed peripheral vertigo. He was instructed to take the medications as needed. Today he states he has stopped taking meclizine and zofran. Dizziness is very minimal at this time. He does not monitor his blood pressure at home.  BP Readings from Last 3 Encounters:  01/13/20 112/70  03/19/19 114/73  12/23/Willis 132/86     Past Medical History:  Diagnosis Date  . Asthma     Past  Surgical History:  Procedure Laterality Date  . EYE SURGERY      Family History  Problem Relation Age of Onset  . Hypertension Neg Hx   . Diabetes Neg Hx     Social History   Socioeconomic History  . Marital status: Married    Spouse name: Not on file  . Number of children: Not on file  . Years of education: Not on file  . Highest education level: Not on file  Occupational History  . Not on file  Tobacco Use  . Smoking status: Never Smoker  . Smokeless tobacco: Never Used  Substance and Sexual Activity  . Alcohol use: Yes    Comment: occ  . Drug use: No  . Sexual activity: Yes  Other Topics Concern  . Not on file  Social History Narrative  . Not on file   Social Determinants of Health   Financial Resource Strain:   . Difficulty of Paying Living Expenses: Not on file  Food Insecurity:   . Worried About Charity fundraiser in the Last Year: Not on file  . Ran Out of Food in the Last Year: Not on file  Transportation Needs:   . Lack of Transportation (Medical): Not on file  . Lack of Transportation (Non-Medical): Not on file  Physical Activity:   . Days of Exercise per Week: Not on file  . Minutes of Exercise per Session: Not on file  Stress:   . Feeling of Stress : Not on file  Social Connections:   . Frequency of Communication with Friends and  Family: Not on file  . Frequency of Social Gatherings with Friends and Family: Not on file  . Attends Religious Services: Not on file  . Active Member of Clubs or Organizations: Not on file  . Attends Archivist Meetings: Not on file  . Marital Status: Not on file     Observations/Objective: Awake, alert and oriented x 3   Review of Systems  Constitutional: Positive for malaise/fatigue. Negative for fever and weight loss.  HENT: Negative.  Negative for nosebleeds.   Eyes: Negative.  Negative for blurred vision, double vision and photophobia.  Respiratory: Negative.  Negative for cough and shortness of  breath.   Cardiovascular: Negative.  Negative for chest pain, palpitations and leg swelling.  Gastrointestinal: Negative.  Negative for heartburn, nausea and vomiting.  Musculoskeletal: Negative.  Negative for myalgias.  Neurological: Positive for dizziness. Negative for focal weakness, seizures and headaches.  Psychiatric/Behavioral: Negative.  Negative for suicidal ideas.    Assessment and Plan: [name] was seen today for hospitalization follow-up.  Diagnoses and all orders for this visit:  Encounter to establish care  Dizziness -     CBC -     CMP14+EGFR  Encounter for screening for diabetes mellitus -     Hemoglobin A1c  Lipid screening -     Lipid Panel     Follow Up Instructions Return in about 2 months (around 04/08/2020).     I discussed the assessment and treatment plan with the patient. The patient was provided an opportunity to ask questions and all were answered. The patient agreed with the plan and demonstrated an understanding of the instructions.   The patient was advised to call back or seek an in-person evaluation if the symptoms worsen or if the condition fails to improve as anticipated.  I provided 17 minutes of non-face-to-face time during this encounter including median intraservice time, reviewing previous notes, labs, imaging, medications and explaining diagnosis and management.  Gildardo Pounds, FNP-BC

## 2020-02-10 LAB — CMP14+EGFR
ALT: 31 IU/L (ref 0–44)
AST: 22 IU/L (ref 0–40)
Albumin/Globulin Ratio: 1.1 — ABNORMAL LOW (ref 1.2–2.2)
Albumin: 4.1 g/dL (ref 4.0–5.0)
Alkaline Phosphatase: 146 IU/L — ABNORMAL HIGH (ref 39–117)
BUN/Creatinine Ratio: 19 (ref 9–20)
BUN: 16 mg/dL (ref 6–24)
Bilirubin Total: 0.5 mg/dL (ref 0.0–1.2)
CO2: 25 mmol/L (ref 20–29)
Calcium: 9.6 mg/dL (ref 8.7–10.2)
Chloride: 98 mmol/L (ref 96–106)
Creatinine, Ser: 0.84 mg/dL (ref 0.76–1.27)
GFR calc Af Amer: 119 mL/min/{1.73_m2} (ref >59)
GFR calc non Af Amer: 103 mL/min/{1.73_m2} (ref >59)
Globulin, Total: 3.8 g/dL (ref 1.5–4.5)
Glucose: 102 mg/dL — ABNORMAL HIGH (ref 65–99)
Potassium: 4.4 mmol/L (ref 3.5–5.2)
Sodium: 137 mmol/L (ref 134–144)
Total Protein: 7.9 g/dL (ref 6.0–8.5)

## 2020-02-10 LAB — CBC
Hematocrit: 51 % (ref 37.5–51.0)
Hemoglobin: 17.3 g/dL (ref 13.0–17.7)
MCH: 29.3 pg (ref 26.6–33.0)
MCHC: 33.9 g/dL (ref 31.5–35.7)
MCV: 86 fL (ref 79–97)
Platelets: 235 10*3/uL (ref 150–450)
RBC: 5.9 x10E6/uL — ABNORMAL HIGH (ref 4.14–5.80)
RDW: 12.8 % (ref 11.6–15.4)
WBC: 8 10*3/uL (ref 3.4–10.8)

## 2020-02-10 LAB — LIPID PANEL
Chol/HDL Ratio: 7.5 ratio — ABNORMAL HIGH (ref 0.0–5.0)
Cholesterol, Total: 232 mg/dL — ABNORMAL HIGH (ref 100–199)
HDL: 31 mg/dL — ABNORMAL LOW (ref >39)
LDL Chol Calc (NIH): 116 mg/dL — ABNORMAL HIGH (ref 0–99)
Triglycerides: 483 mg/dL — ABNORMAL HIGH (ref 0–149)
VLDL Cholesterol Cal: 85 mg/dL — ABNORMAL HIGH (ref 5–40)

## 2020-02-10 LAB — HEMOGLOBIN A1C
Est. average glucose Bld gHb Est-mCnc: 120 mg/dL
Hgb A1c MFr Bld: 5.8 % — ABNORMAL HIGH (ref 4.8–5.6)

## 2020-02-11 ENCOUNTER — Other Ambulatory Visit: Payer: Self-pay | Admitting: Nurse Practitioner

## 2020-02-11 MED ORDER — ATORVASTATIN CALCIUM 20 MG PO TABS: 20.0000 mg | ORAL_TABLET | Freq: Every day | ORAL | 3 refills | Status: DC

## 2020-02-11 MED FILL — ATORVASTATIN 20 MG TABLET: 20 | 90 days supply | Qty: 90 | Fill #0

## 2020-02-17 ENCOUNTER — Encounter: Payer: Self-pay | Admitting: Nurse Practitioner

## 2020-02-24 ENCOUNTER — Other Ambulatory Visit: Payer: Self-pay

## 2020-02-24 ENCOUNTER — Encounter: Payer: Self-pay | Admitting: Nurse Practitioner

## 2020-02-24 ENCOUNTER — Ambulatory Visit: Payer: Medicaid Other | Attending: Nurse Practitioner | Admitting: Nurse Practitioner

## 2020-02-24 DIAGNOSIS — R42 Dizziness and giddiness: Secondary | ICD-10-CM | POA: Diagnosis not present

## 2020-02-24 DIAGNOSIS — H81399 Other peripheral vertigo, unspecified ear: Secondary | ICD-10-CM

## 2020-02-24 NOTE — Progress Notes (Signed)
Virtual Visit via Telephone Note Due to national recommendations of social distancing due to [place] 19, telehealth visit is felt to be most appropriate for this patient at this time.  I discussed the limitations, risks, security and privacy concerns of performing an evaluation and management service by telephone and the availability of in person appointments. I also discussed with the patient that there may be a patient responsible charge related to this service. The patient expressed understanding and agreed to proceed.    I connected with Juan Willis on 02/24/20  at  10:30 AM EST  EDT by telephone and verified that I am speaking with the correct person using two identifiers.   Consent I discussed the limitations, risks, security and privacy concerns of performing an evaluation and management service by telephone and the availability of in person appointments. I also discussed with the patient that there may be a patient responsible charge related to this service. The patient expressed understanding and agreed to proceed.   Location of Patient: Private Residence    Location of Provider: [place] and [place] participating in Telemedicine visit: Juan Rankins FNP-BC [place]    History of Present Illness: Telemedicine visit for: F/U to Dizziness  BP Readings from Last 3 Encounters:  03/19/20 111/82  01/13/20 112/70  03/19/19 114/73   Vertigo - Dizziness:  NEGATIVE ORTHOSTATICS Patient presents with dizziness .  The dizziness has been present for several months. He was diagnosed with peripheral vertigo a few months ago. He was prescribed meclizine and zofran for his symptoms however he stopped taking these medications as his dizziness had improved. However now his symptoms have returned. The patient describes the symptoms as "room spinning". Symptoms are exacerbated by motion The patient also complains of nausea . Patient denies aural  pressure, otalgia, Otorrhea, tinnitus and hearing loss. ]  The 10-year ASCVD risk score Mikey Bussing DC Jr., et al., 2013) is: 4.3%   Values used to calculate the score:     Age: 49 years     Sex: Male     Is Non-Hispanic African American: No     Diabetic: No     Tobacco smoker: No     Systolic Blood Pressure: 604 mmHg     Is BP treated: No     HDL Cholesterol: 30 mg/dL     Total Cholesterol: 203 mg/dL  Past Medical History:  Diagnosis Date  . Asthma     Past Surgical History:  Procedure Laterality Date  . EYE SURGERY      Family History  Problem Relation Age of Onset  . Hypertension Neg Hx   . Diabetes Neg Hx     Social History   Socioeconomic History  . Marital status: Married    Spouse name: Not on file  . Number of children: Not on file  . Years of education: Not on file  . Highest education level: Not on file  Occupational History  . Not on file  Tobacco Use  . Smoking status: Never Smoker  . Smokeless tobacco: Never Used  Substance and Sexual Activity  . Alcohol use: Yes    Comment: occ  . Drug use: No  . Sexual activity: Yes  Other Topics Concern  . Not on file  Social History Narrative  . Not on file   Social Determinants of Health   Financial Resource Strain:   . Difficulty of Paying Living Expenses:   Food Insecurity:   .  Worried About Programme researcher, broadcasting/film/video in the Last Year:   . Barista in the Last Year:   Transportation Needs:   . Freight forwarder (Medical):   Marland Kitchen Lack of Transportation (Non-Medical):   Physical Activity:   . Days of Exercise per Week:   . Minutes of Exercise per Session:   Stress:   . Feeling of Stress :   Social Connections:   . Frequency of Communication with Friends and Family:   . Frequency of Social Gatherings with Friends and Family:   . Attends Religious Services:   . Active Member of Clubs or Organizations:   . Attends Banker Meetings:   Marland Kitchen Marital Status:       Observations/Objective: Awake, alert and oriented x 3   Review of Systems  Constitutional: Negative for fever, malaise/fatigue and weight loss.  HENT: Negative.  Negative for nosebleeds.   Eyes: Negative.  Negative for blurred vision, double vision and photophobia.  Respiratory: Negative.  Negative for cough and shortness of breath.   Cardiovascular: Negative.  Negative for chest pain, palpitations and leg swelling.  Gastrointestinal: Positive for nausea. Negative for heartburn and vomiting.  Musculoskeletal: Negative.  Negative for myalgias.  Neurological: Positive for dizziness. Negative for focal weakness, seizures and headaches.  Psychiatric/Behavioral: Negative.  Negative for suicidal ideas.    Assessment and Plan: [name] was seen today for dizziness.  Diagnoses and all orders for this visit:  Peripheral vertigo, unspecified laterality -     meclizine (ANTIVERT) 50 MG tablet; Take 1 tablet (50 mg total) by mouth 3 (three) times daily as needed for dizziness. -     ondansetron (ZOFRAN ODT) 8 MG disintegrating tablet; Take 1 tablet (8 mg total) by mouth every 8 (eight) hours as needed for nausea or vomiting.     Follow Up Instructions Return in about 4 weeks (around 03/23/2020).     I discussed the assessment and treatment plan with the patient. The patient was provided an opportunity to ask questions and all were answered. The patient agreed with the plan and demonstrated an understanding of the instructions.   The patient was advised to call back or seek an in-person evaluation if the symptoms worsen or if the condition fails to improve as anticipated.  I provided 13 minutes of non-face-to-face time during this encounter including median intraservice time, reviewing previous notes, labs, imaging, medications and explaining diagnosis and management.  Claiborne Rigg, FNP-BC

## 2020-02-25 ENCOUNTER — Ambulatory Visit: Payer: Medicaid Other | Attending: Nurse Practitioner

## 2020-02-25 ENCOUNTER — Other Ambulatory Visit: Payer: Self-pay

## 2020-02-25 DIAGNOSIS — Z13220 Encounter for screening for lipoid disorders: Secondary | ICD-10-CM | POA: Diagnosis not present

## 2020-02-25 NOTE — Progress Notes (Signed)
Name and DOB verified Pt is here for orthostatic hypertension metrics as instructed by PCP  Procedure was  explained to patient. Verbalized understanding Procedure well tolerated/ denied dizziness at the time  Orthostatic hypertension readings Laying  114/78 P 62 Sitting 108/80 P 68 Standing 108/80 P 73

## 2020-02-26 LAB — LIPID PANEL
Chol/HDL Ratio: 6.8 ratio — ABNORMAL HIGH (ref 0.0–5.0)
Cholesterol, Total: 203 mg/dL — ABNORMAL HIGH (ref 100–199)
HDL: 30 mg/dL — ABNORMAL LOW (ref >39)
LDL Chol Calc (NIH): 140 mg/dL — ABNORMAL HIGH (ref 0–99)
Triglycerides: 183 mg/dL — ABNORMAL HIGH (ref 0–149)
VLDL Cholesterol Cal: 33 mg/dL (ref 5–40)

## 2020-03-03 MED FILL — ATORVASTATIN 20 MG TABLET: 20 | 90 days supply | Qty: 90 | Fill #0

## 2020-03-18 ENCOUNTER — Other Ambulatory Visit: Payer: Self-pay

## 2020-03-18 DIAGNOSIS — J9801 Acute bronchospasm: Secondary | ICD-10-CM | POA: Insufficient documentation

## 2020-03-18 DIAGNOSIS — R0602 Shortness of breath: Secondary | ICD-10-CM | POA: Diagnosis not present

## 2020-03-19 ENCOUNTER — Encounter (HOSPITAL_BASED_OUTPATIENT_CLINIC_OR_DEPARTMENT_OTHER): Payer: Self-pay | Admitting: Emergency Medicine

## 2020-03-19 ENCOUNTER — Emergency Department (HOSPITAL_BASED_OUTPATIENT_CLINIC_OR_DEPARTMENT_OTHER)
Admission: EM | Admit: 2020-03-19 | Discharge: 2020-03-19 | Disposition: A | Discharge status: Home / Self Care | Payer: Medicaid Other | Attending: Emergency Medicine | Admitting: Emergency Medicine

## 2020-03-19 ENCOUNTER — Emergency Department (HOSPITAL_BASED_OUTPATIENT_CLINIC_OR_DEPARTMENT_OTHER): Payer: Medicaid Other

## 2020-03-19 ENCOUNTER — Other Ambulatory Visit: Payer: Self-pay

## 2020-03-19 DIAGNOSIS — J9801 Acute bronchospasm: Secondary | ICD-10-CM

## 2020-03-19 DIAGNOSIS — R0602 Shortness of breath: Secondary | ICD-10-CM | POA: Diagnosis not present

## 2020-03-19 LAB — BASIC METABOLIC PANEL
Anion gap: 8 (ref 5–15)
BUN: 19 mg/dL (ref 6–20)
CO2: 23 mmol/L (ref 22–32)
Calcium: 9.1 mg/dL (ref 8.9–10.3)
Chloride: 104 mmol/L (ref 98–111)
Creatinine, Ser: 0.74 mg/dL (ref 0.61–1.24)
GFR calc Af Amer: >60 mL/min (ref >60)
GFR calc non Af Amer: >60 mL/min (ref >60)
Glucose, Bld: 112 mg/dL — ABNORMAL HIGH (ref 70–99)
Potassium: 3.4 mmol/L — ABNORMAL LOW (ref 3.5–5.1)
Sodium: 135 mmol/L (ref 135–145)

## 2020-03-19 LAB — CBC WITH DIFFERENTIAL/PLATELET
Abs Immature Granulocytes: 0.01 10*3/uL (ref 0.00–0.07)
Basophils Absolute: 0.1 10*3/uL (ref 0.0–0.1)
Basophils Relative: 1 %
Eosinophils Absolute: 0.4 10*3/uL (ref 0.0–0.5)
Eosinophils Relative: 4 %
HCT: 47 % (ref 39.0–52.0)
Hemoglobin: 16.3 g/dL (ref 13.0–17.0)
Immature Granulocytes: 0 %
Lymphocytes Relative: 49 %
Lymphs Abs: 4.4 10*3/uL — ABNORMAL HIGH (ref 0.7–4.0)
MCH: 29.6 pg (ref 26.0–34.0)
MCHC: 34.7 g/dL (ref 30.0–36.0)
MCV: 85.3 fL (ref 80.0–100.0)
Monocytes Absolute: 0.9 10*3/uL (ref 0.1–1.0)
Monocytes Relative: 9 %
Neutro Abs: 3.4 10*3/uL (ref 1.7–7.7)
Neutrophils Relative %: 37 %
Platelets: 188 10*3/uL (ref 150–400)
RBC: 5.51 MIL/uL (ref 4.22–5.81)
RDW: 12.8 % (ref 11.5–15.5)
WBC: 9.2 10*3/uL (ref 4.0–10.5)
nRBC: 0 % (ref 0.0–0.2)

## 2020-03-19 LAB — BRAIN NATRIURETIC PEPTIDE: B Natriuretic Peptide: 25.5 pg/mL (ref 0.0–100.0)

## 2020-03-19 MED ORDER — ALBUTEROL SULFATE HFA 108 (90 BASE) MCG/ACT IN AERS: 2.0000 | INHALATION_SPRAY | RESPIRATORY_TRACT | Status: DC | PRN

## 2020-03-19 MED ORDER — ALBUTEROL SULFATE HFA 108 (90 BASE) MCG/ACT IN AERS
4.0000 | INHALATION_SPRAY | Freq: Once | RESPIRATORY_TRACT | Status: AC
  Administered 2020-03-19: 4 via RESPIRATORY_TRACT
  Filled 2020-03-19: qty 6.7

## 2020-03-19 MED ORDER — DEXAMETHASONE SODIUM PHOSPHATE 10 MG/ML IJ SOLN
10.0000 mg | Freq: Once | INTRAMUSCULAR | Status: DC
  Filled 2020-03-19: qty 1

## 2020-03-19 MED ORDER — DEXAMETHASONE SODIUM PHOSPHATE 10 MG/ML IJ SOLN
10.0000 mg | Freq: Once | INTRAMUSCULAR | Status: AC
  Administered 2020-03-19: 10 mg via INTRAMUSCULAR

## 2020-03-19 NOTE — ED Triage Notes (Signed)
Pt reports SHOB worse with laying down since yesterday. Denies swelling in feet or legs. Reports hx of asthma. Denies any medications to treat asthma. Denies sx other than SHOB. Denies covid exposure that he is aware of.

## 2020-03-19 NOTE — ED Provider Notes (Signed)
[facility] Provider Note: [name], MD, FACEP  CSN: [id number] MRN: [id number] ARRIVAL: 03/18/20 at 2358 ROOM: [facility]06/[facility]06   CHIEF COMPLAINT  Shortness of Breath   HISTORY OF PRESENT ILLNESS  03/19/20 5:41 AM Juan Willis is a 49 y.o. male with a history of asthma not currently on any asthma treatment.  He is here with 2 days of shortness of breath.  The shortness of breath is worse when lying supine and better when sitting up.  He feels like he has to work harder to pull air into his lungs though he has not been wheezing.  He denies any chest pain, cough or other respiratory symptoms.  He denies lower extremity edema.  Symptoms are severe enough to keep him from sleeping at night.   Past Medical History:  Diagnosis Date  . Asthma     Past Surgical History:  Procedure Laterality Date  . EYE SURGERY      Family History  Problem Relation Age of Onset  . Hypertension Neg Hx   . Diabetes Neg Hx     Social History   Tobacco Use  . Smoking status: Never Smoker  . Smokeless tobacco: Never Used  Substance Use Topics  . Alcohol use: Yes    Comment: occ  . Drug use: No    Prior to Admission medications   Medication Sig Start Date End Date Taking? Authorizing Provider  atorvastatin (LIPITOR) 20 MG tablet Take 1 tablet (20 mg total) by mouth daily. Patient not taking: Reported on 02/24/2020 02/11/20   Claiborne Rigg, NP  meclizine (ANTIVERT) 50 MG tablet Take 1 tablet (50 mg total) by mouth 3 (three) times daily as needed for dizziness. 01/13/20   Linwood Dibbles, MD  omeprazole (PRILOSEC) 20 MG capsule Take 1 capsule (20 mg total) by mouth daily. Patient not taking: Reported on 02/09/2020 03/19/19   Petrucelli, Lelon Mast R, PA-C  ondansetron (ZOFRAN ODT) 8 MG disintegrating tablet Take 1 tablet (8 mg total) by mouth every 8 (eight) hours as needed for nausea or vomiting. 01/13/20   Linwood Dibbles, MD    Allergies Patient has no known allergies.   REVIEW OF SYSTEMS    Negative except as noted here or in the History of Present Illness.   PHYSICAL EXAMINATION  Initial Vital Signs Blood pressure 108/78, pulse (!) 57, temperature 98.4 F (36.9 C), temperature source Oral, resp. rate 13, height 5\' 6"  (1.676 m), weight 83.5 kg, SpO2 97 %.  Examination General: Well-developed, well-nourished male in no acute distress; appearance consistent with age of record HENT: normocephalic; atraumatic Eyes: pupils equal, round and reactive to light; extraocular muscles intact Neck: supple Heart: regular rate and rhythm; no murmur Lungs: Decreased air movement bilaterally without wheezing Abdomen: soft; nondistended; nontender; bowel sounds present Extremities: No deformity; full range of motion Neurologic: Awake, alert and oriented; motor function intact in all extremities and symmetric; no facial droop Skin: Warm and dry Psychiatric: Normal mood and affect   RESULTS  Summary of this visit's results, reviewed and interpreted by myself:   EKG Interpretation  Date/Time:    Ventricular Rate:    PR Interval:    QRS Duration:   QT Interval:    QTC Calculation:   R Axis:     Text Interpretation:        Laboratory Studies: Results for orders placed or performed during the hospital encounter of 03/19/20 (from the past 24 hour(s))  CBC with Differential     Status: Abnormal  Collection Time: 03/19/20  3:28 AM  Result Value Ref Range   WBC 9.2 4.0 - 10.5 K/uL   RBC 5.51 4.22 - 5.81 MIL/uL   Hemoglobin 16.3 13.0 - 17.0 g/dL   HCT 47.0 39.0 - 52.0 %   MCV 85.3 80.0 - 100.0 fL   MCH 29.6 26.0 - 34.0 pg   MCHC 34.7 30.0 - 36.0 g/dL   RDW 12.8 11.5 - 15.5 %   Platelets 188 150 - 400 K/uL   nRBC 0.0 0.0 - 0.2 %   Neutrophils Relative % 37 %   Neutro Abs 3.4 1.7 - 7.7 K/uL   Lymphocytes Relative 49 %   Lymphs Abs 4.4 (H) 0.7 - 4.0 K/uL   Monocytes Relative 9 %   Monocytes Absolute 0.9 0.1 - 1.0 K/uL   Eosinophils Relative 4 %   Eosinophils Absolute 0.4  0.0 - 0.5 K/uL   Basophils Relative 1 %   Basophils Absolute 0.1 0.0 - 0.1 K/uL   Immature Granulocytes 0 %   Abs Immature Granulocytes 0.01 0.00 - 0.07 K/uL  Basic metabolic panel     Status: Abnormal   Collection Time: 03/19/20  3:28 AM  Result Value Ref Range   Sodium 135 135 - 145 mmol/L   Potassium 3.4 (L) 3.5 - 5.1 mmol/L   Chloride 104 98 - 111 mmol/L   CO2 23 22 - 32 mmol/L   Glucose, Bld 112 (H) 70 - 99 mg/dL   BUN 19 6 - 20 mg/dL   Creatinine, Ser 0.74 0.61 - 1.24 mg/dL   Calcium 9.1 8.9 - 10.3 mg/dL   GFR calc non Af Amer >60 >60 mL/min   GFR calc Af Amer >60 >60 mL/min   Anion gap 8 5 - 15  Brain natriuretic peptide     Status: None   Collection Time: 03/19/20  3:28 AM  Result Value Ref Range   B Natriuretic Peptide 25.5 0.0 - 100.0 pg/mL   Imaging Studies: DG Chest Portable 1 View  Result Date: 03/19/2020 CLINICAL DATA:  Shortness of breath EXAM: PORTABLE CHEST 1 VIEW COMPARISON:  March 19, 2019 FINDINGS: The heart size and mediastinal contours are borderline enlarged. There is mild prominence of the central pulmonary vasculature. No large airspace consolidation or pleural effusion. No acute osseous abnormality. IMPRESSION: Pulmonary vascular congestion. Electronically Signed   By: Prudencio Pair M.D.   On: 03/19/2020 03:24    ED COURSE and MDM  Nursing notes, initial and subsequent vitals signs, including pulse oximetry, reviewed and interpreted by myself.  Vitals:   03/19/20 0331 03/19/20 0400 03/19/20 0539 03/19/20 0611  BP: 108/69 108/78    Pulse: (!) 51 (!) 57 61   Resp: 13 13 18    Temp:      TempSrc:      SpO2: 99% 97% 100% 96%  Weight:      Height:       Medications  dexamethasone (DECADRON) injection 10 mg (has no administration in time range)  albuterol (VENTOLIN HFA) 108 (90 Base) MCG/ACT inhaler 4 puff (4 puffs Inhalation Given 03/19/20 0605)    6:18 AM Significantly improved air movement after 4 puffs of albuterol.  Patient now able to lie  supine comfortably.  He was provided an albuterol inhaler and AeroChamber and instructed in their use.  He was also given dexamethasone 10 mg IV.  I suspect this may be due in part to seasonal allergies as there is heavy pollen in the air presently.  PROCEDURES  Procedures   ED DIAGNOSES     ICD-10-CM   1. Acute bronchospasm  J98.01        Zaiah Credeur, Jonny Ruiz, MD 03/19/20 [phone]

## 2020-03-23 ENCOUNTER — Encounter: Payer: Self-pay | Admitting: Nurse Practitioner

## 2020-03-23 MED ORDER — ONDANSETRON 8 MG PO TBDP: 8.0000 mg | ORAL_TABLET | Freq: Three times a day (TID) | ORAL | 1 refills | Status: DC | PRN

## 2020-03-23 MED ORDER — MECLIZINE HCL 50 MG PO TABS: 50.0000 mg | ORAL_TABLET | Freq: Three times a day (TID) | ORAL | 1 refills | Status: DC | PRN

## 2020-03-30 ENCOUNTER — Ambulatory Visit: Payer: Medicaid Other | Admitting: Nurse Practitioner

## 2020-03-31 ENCOUNTER — Other Ambulatory Visit: Payer: Self-pay

## 2020-03-31 ENCOUNTER — Ambulatory Visit: Payer: Medicaid Other | Attending: Nurse Practitioner | Admitting: Nurse Practitioner

## 2020-03-31 ENCOUNTER — Encounter: Payer: Self-pay | Admitting: Nurse Practitioner

## 2020-03-31 DIAGNOSIS — R002 Palpitations: Secondary | ICD-10-CM

## 2020-03-31 MED ORDER — ALBUTEROL SULFATE HFA 108 (90 BASE) MCG/ACT IN AERS: 2.0000 | INHALATION_SPRAY | Freq: Four times a day (QID) | RESPIRATORY_TRACT | 0 refills | Status: DC | PRN

## 2020-03-31 MED FILL — ALBUTEROL SULFATE HFA 108 (: 108 (90 BAS | 25 days supply | Qty: 18 | Fill #0

## 2020-03-31 NOTE — Progress Notes (Signed)
Virtual Visit via Telephone Note Due to national recommendations of social distancing due to [place] 19, telehealth visit is felt to be most appropriate for this patient at this time.  I discussed the limitations, risks, security and privacy concerns of performing an evaluation and management service by telephone and the availability of in person appointments. I also discussed with the patient that there may be a patient responsible charge related to this service. The patient expressed understanding and agreed to proceed.    I connected with Juan Willis on 03/31/20  at   8:50 AM EDT  EDT by telephone and verified that I am speaking with the correct person using two identifiers.   Consent I discussed the limitations, risks, security and privacy concerns of performing an evaluation and management service by telephone and the availability of in person appointments. I also discussed with the patient that there may be a patient responsible charge related to this service. The patient expressed understanding and agreed to proceed.   Location of Patient: Private Residence    Location of Provider: [place] and [place] participating in Telemedicine visit: Geryl Rankins FNP-BC [place]    History of Present Illness: Telemedicine visit for: Hospital F/U  Seen in the Emergency room on 03-19-2020 for increased shortness of breath and palpitations. He was administered 4 puffs of albuterol and IV dexamethasone with significant improvement in his symptoms.  He was provided an albuterol inhaler and AeroChamber and instructed in their use upon discharge.  It was suspected that seasonal allergies/heavy pollen were exacerbating his asthma  Today he states a few days ago he experienced another episode of feeling as if his heart was racing along with shortness of breath. As EKG was not performed in the hospital will order her in the office and also check TSH.   He  denies any current symptoms today. He also has a history of dizziness/vertigo. May need cardiology referral for event monitor.    Past Medical History:  Diagnosis Date  . Asthma     Past Surgical History:  Procedure Laterality Date  . EYE SURGERY      Family History  Problem Relation Age of Onset  . Hypertension Neg Hx   . Diabetes Neg Hx     Social History   Socioeconomic History  . Marital status: Married    Spouse name: Not on file  . Number of children: Not on file  . Years of education: Not on file  . Highest education level: Not on file  Occupational History  . Not on file  Tobacco Use  . Smoking status: Never Smoker  . Smokeless tobacco: Never Used  Substance and Sexual Activity  . Alcohol use: Yes    Comment: occ  . Drug use: No  . Sexual activity: Yes  Other Topics Concern  . Not on file  Social History Narrative  . Not on file   Social Determinants of Health   Financial Resource Strain:   . Difficulty of Paying Living Expenses:   Food Insecurity:   . Worried About Charity fundraiser in the Last Year:   . Arboriculturist in the Last Year:   Transportation Needs:   . Film/video editor (Medical):   Marland Kitchen Lack of Transportation (Non-Medical):   Physical Activity:   . Days of Exercise per Week:   . Minutes of Exercise per Session:   Stress:   . Feeling of Stress :  Social Connections:   . Frequency of Communication with Friends and Family:   . Frequency of Social Gatherings with Friends and Family:   . Attends Religious Services:   . Active Member of Clubs or Organizations:   . Attends Banker Meetings:   Marland Kitchen Marital Status:      Observations/Objective: Awake, alert and oriented x 3   Review of Systems  Constitutional: Negative for fever, malaise/fatigue and weight loss.  HENT: Negative.  Negative for nosebleeds.   Eyes: Negative.  Negative for blurred vision, double vision and photophobia.  Respiratory: Positive for shortness  of breath. Negative for cough.   Cardiovascular: Positive for palpitations. Negative for chest pain and leg swelling.  Gastrointestinal: Negative.  Negative for heartburn, nausea and vomiting.  Musculoskeletal: Negative.  Negative for myalgias.  Neurological: Positive for dizziness. Negative for focal weakness, seizures and headaches.  Psychiatric/Behavioral: Negative.  Negative for suicidal ideas.    Assessment and Plan: Juan Willis was seen today for hospitalization follow-up.  Diagnoses and all orders for this visit:  Heart palpitations -     TSH; Future  Other orders -     albuterol (VENTOLIN HFA) 108 (90 Base) MCG/ACT inhaler; Inhale 2 puffs into the lungs every 6 (six) hours as needed for wheezing or shortness of breath.     Follow Up Instructions No follow-ups on file.     I discussed the assessment and treatment plan with the patient. The patient was provided an opportunity to ask questions and all were answered. The patient agreed with the plan and demonstrated an understanding of the instructions.   The patient was advised to call back or seek an in-person evaluation if the symptoms worsen or if the condition fails to improve as anticipated.  I provided 14 minutes of non-face-to-face time during this encounter including median intraservice time, reviewing previous notes, labs, imaging, medications and explaining diagnosis and management.  Claiborne Rigg, FNP-BC

## 2020-04-03 ENCOUNTER — Other Ambulatory Visit: Payer: Self-pay

## 2020-04-03 ENCOUNTER — Ambulatory Visit: Payer: Medicaid Other | Attending: Family Medicine

## 2020-04-03 DIAGNOSIS — R002 Palpitations: Secondary | ICD-10-CM | POA: Insufficient documentation

## 2020-04-03 NOTE — Progress Notes (Signed)
Pt was referred by PCP for nurse visit.Pt alert and oriented X3 . No signs of acute distress Here for EKG as instructed by PCP. EKG and BP check. Reported to PCP No further instructions given.  Advised pt to f /u as planned/ verbalized understanding

## 2020-04-04 LAB — TSH: TSH: 1.6 u[IU]/mL (ref 0.450–4.500)

## 2020-05-29 DIAGNOSIS — H538 Other visual disturbances: Secondary | ICD-10-CM | POA: Diagnosis not present

## 2020-05-29 DIAGNOSIS — H524 Presbyopia: Secondary | ICD-10-CM | POA: Diagnosis not present

## 2020-06-10 ENCOUNTER — Ambulatory Visit: Payer: Medicaid Other | Attending: Internal Medicine

## 2020-06-10 DIAGNOSIS — Z23 Encounter for immunization: Secondary | ICD-10-CM

## 2020-06-10 NOTE — Progress Notes (Signed)
° °  Covid-19 Vaccination Clinic  Name:  Abisai Deer    MRN: [id number] DOB: 1971-11-13  06/10/2020  Mr. Crass was observed post Covid-19 immunization for 15 minutes without incident. He was provided with Vaccine Information Sheet and instruction to access the V-Safe system.   Mr. Desire was instructed to call 911 with any severe reactions post vaccine:  Difficulty breathing   Swelling of face and throat   A fast heartbeat   A bad rash all over body   Dizziness and weakness   Immunizations Administered    Name Date Dose VIS Date Route   Pfizer COVID-19 Vaccine 06/10/2020 11:57 AM 0.3 mL 02/16/2019 Intramuscular   Manufacturer: [employer], [employer]   Lot: [id number]   NDC: [address]-0

## 2020-06-12 ENCOUNTER — Ambulatory Visit: Payer: Medicaid Other

## 2020-07-01 ENCOUNTER — Ambulatory Visit: Payer: Medicaid Other | Attending: Internal Medicine

## 2020-07-01 DIAGNOSIS — Z23 Encounter for immunization: Secondary | ICD-10-CM

## 2020-07-01 NOTE — Progress Notes (Signed)
   Covid-19 Vaccination Clinic  Name:  Juan Willis    MRN: [id number] DOB: 1971-10-10  07/01/2020  [name] was observed post Covid-19 immunization for 15 minutes without incident. He was provided with Vaccine Information Sheet and instruction to access the V-Safe system.   [name] was instructed to call 911 with any severe reactions post vaccine: Marland Kitchen Difficulty breathing  . Swelling of face and throat  . A fast heartbeat  . A bad rash all over body  . Dizziness and weakness   Immunizations Administered    Name Date Dose VIS Date Route   Pfizer COVID-19 Vaccine 07/01/2020  9:55 AM 0.3 mL 02/16/2019 Intramuscular   Manufacturer: [employer], [employer]   Lot: [id number]   NDC: [address]-1

## 2020-08-02 ENCOUNTER — Other Ambulatory Visit: Payer: Self-pay

## 2020-08-02 ENCOUNTER — Emergency Department (HOSPITAL_BASED_OUTPATIENT_CLINIC_OR_DEPARTMENT_OTHER): Payer: Medicaid Other

## 2020-08-02 ENCOUNTER — Encounter (HOSPITAL_BASED_OUTPATIENT_CLINIC_OR_DEPARTMENT_OTHER): Payer: Self-pay | Admitting: Emergency Medicine

## 2020-08-02 ENCOUNTER — Emergency Department (HOSPITAL_BASED_OUTPATIENT_CLINIC_OR_DEPARTMENT_OTHER)
Admission: EM | Admit: 2020-08-02 | Discharge: 2020-08-02 | Disposition: A | Discharge status: Home / Self Care | Payer: Medicaid Other | Attending: Emergency Medicine | Admitting: Emergency Medicine

## 2020-08-02 DIAGNOSIS — R0609 Other forms of dyspnea: Secondary | ICD-10-CM | POA: Diagnosis not present

## 2020-08-02 DIAGNOSIS — J45909 Unspecified asthma, uncomplicated: Secondary | ICD-10-CM | POA: Insufficient documentation

## 2020-08-02 DIAGNOSIS — R0602 Shortness of breath: Secondary | ICD-10-CM | POA: Insufficient documentation

## 2020-08-02 DIAGNOSIS — R0600 Dyspnea, unspecified: Secondary | ICD-10-CM | POA: Diagnosis not present

## 2020-08-02 DIAGNOSIS — Z79899 Other long term (current) drug therapy: Secondary | ICD-10-CM | POA: Insufficient documentation

## 2020-08-02 DIAGNOSIS — R0689 Other abnormalities of breathing: Secondary | ICD-10-CM

## 2020-08-02 DIAGNOSIS — R002 Palpitations: Secondary | ICD-10-CM | POA: Diagnosis present

## 2020-08-02 DIAGNOSIS — J811 Chronic pulmonary edema: Secondary | ICD-10-CM | POA: Diagnosis not present

## 2020-08-02 DIAGNOSIS — R079 Chest pain, unspecified: Secondary | ICD-10-CM

## 2020-08-02 DIAGNOSIS — R0789 Other chest pain: Secondary | ICD-10-CM | POA: Diagnosis not present

## 2020-08-02 HISTORY — DX: Pure hypercholesterolemia, unspecified: E78.00

## 2020-08-02 LAB — CBC
HCT: 47.5 % (ref 39.0–52.0)
Hemoglobin: 16.1 g/dL (ref 13.0–17.0)
MCH: 28.9 pg (ref 26.0–34.0)
MCHC: 33.9 g/dL (ref 30.0–36.0)
MCV: 85.1 fL (ref 80.0–100.0)
Platelets: 184 10*3/uL (ref 150–400)
RBC: 5.58 MIL/uL (ref 4.22–5.81)
RDW: 13.2 % (ref 11.5–15.5)
WBC: 9.5 10*3/uL (ref 4.0–10.5)
nRBC: 0 % (ref 0.0–0.2)

## 2020-08-02 LAB — BRAIN NATRIURETIC PEPTIDE: B Natriuretic Peptide: 19.3 pg/mL (ref 0.0–100.0)

## 2020-08-02 LAB — COMPREHENSIVE METABOLIC PANEL
ALT: 19 U/L (ref 0–44)
AST: 21 U/L (ref 15–41)
Albumin: 3.9 g/dL (ref 3.5–5.0)
Alkaline Phosphatase: 116 U/L (ref 38–126)
Anion gap: 8 (ref 5–15)
BUN: 15 mg/dL (ref 6–20)
CO2: 24 mmol/L (ref 22–32)
Calcium: 8.9 mg/dL (ref 8.9–10.3)
Chloride: 102 mmol/L (ref 98–111)
Creatinine, Ser: 0.78 mg/dL (ref 0.61–1.24)
GFR calc Af Amer: >60 mL/min (ref >60)
GFR calc non Af Amer: >60 mL/min (ref >60)
Glucose, Bld: 124 mg/dL — ABNORMAL HIGH (ref 70–99)
Potassium: 3.5 mmol/L (ref 3.5–5.1)
Sodium: 134 mmol/L — ABNORMAL LOW (ref 135–145)
Total Bilirubin: 0.5 mg/dL (ref 0.3–1.2)
Total Protein: 7.3 g/dL (ref 6.5–8.1)

## 2020-08-02 LAB — TROPONIN I (HIGH SENSITIVITY)
Troponin I (High Sensitivity): 2 ng/L (ref <18)
Troponin I (High Sensitivity): 3 ng/L (ref <18)

## 2020-08-02 LAB — LIPASE, BLOOD: Lipase: 30 U/L (ref 11–51)

## 2020-08-02 LAB — D-DIMER, QUANTITATIVE: D-Dimer, Quant: <0.27 ug/mL-FEU (ref 0.00–0.50)

## 2020-08-02 MED ORDER — LIDOCAINE VISCOUS HCL 2 % MT SOLN
15.0000 mL | Freq: Once | OROMUCOSAL | Status: AC
  Administered 2020-08-02: 15 mL via ORAL
  Filled 2020-08-02: qty 15

## 2020-08-02 MED ORDER — ALUM & MAG HYDROXIDE-SIMETH 200-200-20 MG/5ML PO SUSP
30.0000 mL | Freq: Once | ORAL | Status: AC
  Administered 2020-08-02: 30 mL via ORAL
  Filled 2020-08-02: qty 30

## 2020-08-02 NOTE — ED Triage Notes (Signed)
Pt states he started feeling like his heart was beating too fast after he ate supper tonight around 8pm   States it got worse when he laid down and he felt like he could not breathe

## 2020-08-02 NOTE — ED Provider Notes (Signed)
[facility] EMERGENCY DEPARTMENT Provider Note  CSN: [id number] Arrival date & time: 08/02/20 0142  Chief Complaint(s) Irregular Heart Beat  HPI Juan Willis is a 49 y.o. male  Here for difficulty taking a big deep breath and feeling palpitations after eating dinner tonight.  Symptoms worse while lying down.  Improved by sitting up.  Denies any overt chest pain.  No nausea or vomiting.  No abdominal pain.  No diarrhea.  No recent fevers or infections.  No coughing or congestion.  HPI  Past Medical History Past Medical History:  Diagnosis Date  . Asthma   . High cholesterol    Patient Active Problem List   Diagnosis Date Noted  . Neck pain 04/14/2017   Home Medication(s) Prior to Admission medications   Medication Sig Start Date End Date Taking? Authorizing Provider  albuterol (VENTOLIN HFA) 108 (90 Base) MCG/ACT inhaler Inhale 2 puffs into the lungs every 6 (six) hours as needed for wheezing or shortness of breath. 03/31/20   Claiborne RiggFleming, Zelda W, NP  meclizine (ANTIVERT) 50 MG tablet Take 1 tablet (50 mg total) by mouth 3 (three) times daily as needed for dizziness. Patient not taking: Reported on 03/31/2020 03/23/20   Claiborne RiggFleming, Zelda W, NP  ondansetron (ZOFRAN ODT) 8 MG disintegrating tablet Take 1 tablet (8 mg total) by mouth every 8 (eight) hours as needed for nausea or vomiting. Patient not taking: Reported on 03/31/2020 03/23/20   Claiborne RiggFleming, Zelda W, NP  atorvastatin (LIPITOR) 20 MG tablet Take 1 tablet (20 mg total) by mouth daily. 02/11/20 03/31/20  Claiborne RiggFleming, Zelda W, NP  omeprazole (PRILOSEC) 20 MG capsule Take 1 capsule (20 mg total) by mouth daily. Patient not taking: Reported on 02/09/2020 03/19/19 03/19/20  Petrucelli, Pleas KochSamantha R, PA-C                                                                                                                                    Past Surgical History Past Surgical History:  Procedure Laterality Date  . EYE SURGERY     Family History Family  History  Problem Relation Age of Onset  . Hypertension Neg Hx   . Diabetes Neg Hx     Social History Social History   Tobacco Use  . Smoking status: Never Smoker  . Smokeless tobacco: Never Used  Vaping Use  . Vaping Use: Never used  Substance Use Topics  . Alcohol use: Yes    Comment: occ  . Drug use: No   Allergies Patient has no known allergies.  Review of Systems Review of Systems All other systems are reviewed and are negative for acute change except as noted in the HPI  Physical Exam Vital Signs  I have reviewed the triage vital signs BP 121/83   Pulse (!) 58   Temp 98.8 F (37.1 C) (Oral)   Resp 14   Ht 5\' 6"  (1.676 m)   Wt 81.6 kg  SpO2 98%   BMI 29.05 kg/m   Physical Exam Vitals reviewed.  Constitutional:      General: He is not in acute distress.    Appearance: He is well-developed. He is not diaphoretic.  HENT:     Head: Normocephalic and atraumatic.     Nose: Nose normal.  Eyes:     General: No scleral icterus.       Right eye: No discharge.        Left eye: No discharge.     Conjunctiva/sclera: Conjunctivae normal.     Pupils: Pupils are equal, round, and reactive to light.  Cardiovascular:     Rate and Rhythm: Normal rate and regular rhythm.     Heart sounds: No murmur heard.  No friction rub. No gallop.   Pulmonary:     Effort: Pulmonary effort is normal. No respiratory distress.     Breath sounds: Normal breath sounds. No stridor. No rales.  Abdominal:     General: There is no distension.     Palpations: Abdomen is soft.     Tenderness: There is no abdominal tenderness.  Musculoskeletal:        General: No tenderness.     Cervical back: Normal range of motion and neck supple.  Skin:    General: Skin is warm and dry.     Findings: No erythema or rash.  Neurological:     Mental Status: He is alert and oriented to person, place, and time.     ED Results and Treatments Labs (all labs ordered are listed, but only abnormal  results are displayed) Labs Reviewed  COMPREHENSIVE METABOLIC PANEL - Abnormal; Notable for the following components:      Result Value   Sodium 134 (*)    Glucose, Bld 124 (*)    All other components within normal limits  CBC  LIPASE, BLOOD  BRAIN NATRIURETIC PEPTIDE  D-DIMER, QUANTITATIVE (NOT AT [facility])  TROPONIN I (HIGH SENSITIVITY)  TROPONIN I (HIGH SENSITIVITY)                                                                                                                         EKG  EKG Interpretation  Date/Time:  Wednesday August 02 2020 01:52:47 EDT Ventricular Rate:  75 PR Interval:  178 QRS Duration: 92 QT Interval:  360 QTC Calculation: 402 R Axis:   32 Text Interpretation: Normal sinus rhythm Normal ECG No acute changes Confirmed by Drema Pry [phone]) on 08/02/2020 2:14:14 AM      Radiology DG Chest 2 View  Result Date: 08/02/2020 CLINICAL DATA:  Chest pain and shortness of breath EXAM: CHEST - 2 VIEW COMPARISON:  None. FINDINGS: The heart size and mediastinal contours are within normal limits. There is prominence of the central pulmonary vasculature. No large airspace consolidation or pleural effusion. No acute osseous abnormality. IMPRESSION: Mild pulmonary vascular congestion. Electronically Signed   By: Jonna Clark M.D.   On: 08/02/2020 02:46  Pertinent labs & imaging results that were available during my care of the patient were reviewed by me and considered in my medical decision making (see chart for details).  Medications Ordered in ED Medications  alum & mag hydroxide-simeth (MAALOX/MYLANTA) 200-200-20 MG/5ML suspension 30 mL (30 mLs Oral Given 08/02/20 0231)    And  lidocaine (XYLOCAINE) 2 % viscous mouth solution 15 mL (15 mLs Oral Given 08/02/20 0231)                                                                                                                                    Procedures Procedures  (including critical care time)  Medical  Decision Making / ED Course I have reviewed the nursing notes for this encounter and the patient's prior records (if available in EHR or on provided paperwork).   Juan Willis was evaluated in Emergency Department on 08/02/2020 for the symptoms described in the history of present illness. He was evaluated in the context of the global COVID-19 pandemic, which necessitated consideration that the patient might be at risk for infection with the SARS-CoV-2 virus that causes COVID-19. Institutional protocols and algorithms that pertain to the evaluation of patients at risk for COVID-19 are in a state of rapid change based on information released by regulatory bodies including the CDC and federal and state organizations. These policies and algorithms were followed during the patient's care in the ED.  Atypical symptoms highly consistent with ACS.  EKG without acute ischemic changes or evidence of pericarditis.  Troponins negative x2.  Low suspicion for pulmonary embolism and dimer negative.  Chest x-ray without evidence suggestive of pneumonia, pneumothorax, pneumomediastinum.  No abnormal contour of the mediastinum to suggest dissection. No cardiomegaly or pulmonary edema.  BNP negative.  Doubt heart failure.  Patient provided with GI cocktail which he reports resulted in significant relief of his symptoms.       Final Clinical Impression(s) / ED Diagnoses Final diagnoses:  Chest pain  Breathing difficulty    The patient appears reasonably screened and/or stabilized for discharge and I doubt any other medical condition or other [facility] requiring further screening, evaluation, or treatment in the ED at this time prior to discharge. Safe for discharge with strict return precautions.  Disposition: Discharge  Condition: Good  I have discussed the results, Dx and Tx plan with the patient/family who expressed understanding and agree(s) with the plan. Discharge instructions discussed at length. The  patient/family was given strict return precautions who verbalized understanding of the instructions. No further questions at time of discharge.    ED Discharge Orders    None       Follow Up: Claiborne Rigg, NP [address] [place] [address] [phone]  Schedule an appointment as soon as possible for a visit  If symptoms do not improve or  worsen     This chart was dictated using voice recognition software.  Despite  best efforts to proofread,  errors can occur which can change the documentation meaning.   Nira Conn, MD 08/02/20 [phone]

## 2020-08-02 NOTE — ED Notes (Signed)
MD with pt  

## 2020-08-02 NOTE — ED Notes (Signed)
Assumed care of patient.

## 2020-08-02 NOTE — ED Notes (Signed)
Patient transported to X-ray 

## 2020-10-23 ENCOUNTER — Emergency Department (HOSPITAL_BASED_OUTPATIENT_CLINIC_OR_DEPARTMENT_OTHER): Payer: Medicaid Other

## 2020-10-23 ENCOUNTER — Other Ambulatory Visit: Payer: Self-pay

## 2020-10-23 ENCOUNTER — Encounter (HOSPITAL_BASED_OUTPATIENT_CLINIC_OR_DEPARTMENT_OTHER): Payer: Self-pay

## 2020-10-23 DIAGNOSIS — R0602 Shortness of breath: Secondary | ICD-10-CM | POA: Diagnosis not present

## 2020-10-23 DIAGNOSIS — J45909 Unspecified asthma, uncomplicated: Secondary | ICD-10-CM | POA: Diagnosis not present

## 2020-10-23 DIAGNOSIS — R079 Chest pain, unspecified: Secondary | ICD-10-CM | POA: Diagnosis not present

## 2020-10-23 LAB — BASIC METABOLIC PANEL
Anion gap: 11 (ref 5–15)
BUN: 24 mg/dL — ABNORMAL HIGH (ref 6–20)
CO2: 23 mmol/L (ref 22–32)
Calcium: 9.1 mg/dL (ref 8.9–10.3)
Chloride: 104 mmol/L (ref 98–111)
Creatinine, Ser: 0.92 mg/dL (ref 0.61–1.24)
GFR, Estimated: >60 mL/min (ref >60)
Glucose, Bld: 102 mg/dL — ABNORMAL HIGH (ref 70–99)
Potassium: 3.9 mmol/L (ref 3.5–5.1)
Sodium: 138 mmol/L (ref 135–145)

## 2020-10-23 LAB — CBC
HCT: 46.7 % (ref 39.0–52.0)
Hemoglobin: 16.4 g/dL (ref 13.0–17.0)
MCH: 30 pg (ref 26.0–34.0)
MCHC: 35.1 g/dL (ref 30.0–36.0)
MCV: 85.5 fL (ref 80.0–100.0)
Platelets: 218 10*3/uL (ref 150–400)
RBC: 5.46 MIL/uL (ref 4.22–5.81)
RDW: 13.6 % (ref 11.5–15.5)
WBC: 10.3 10*3/uL (ref 4.0–10.5)
nRBC: 0 % (ref 0.0–0.2)

## 2020-10-23 NOTE — ED Triage Notes (Signed)
Pt going to sleep ~1 hr ago when he began experiencing SOB, left chest pain & left arm pain. No HA or diaphoresis. Sharp shooting pain.

## 2020-10-24 ENCOUNTER — Emergency Department (HOSPITAL_BASED_OUTPATIENT_CLINIC_OR_DEPARTMENT_OTHER)
Admission: EM | Admit: 2020-10-24 | Discharge: 2020-10-24 | Disposition: A | Discharge status: Home / Self Care | Payer: Medicaid Other | Attending: Emergency Medicine | Admitting: Emergency Medicine

## 2020-10-24 DIAGNOSIS — R0602 Shortness of breath: Secondary | ICD-10-CM

## 2020-10-24 LAB — TROPONIN I (HIGH SENSITIVITY)
Troponin I (High Sensitivity): 3 ng/L (ref <18)
Troponin I (High Sensitivity): 2 ng/L (ref <18)

## 2020-10-24 MED ORDER — ALBUTEROL SULFATE HFA 108 (90 BASE) MCG/ACT IN AERS
2.0000 | INHALATION_SPRAY | RESPIRATORY_TRACT | Status: DC | PRN
  Administered 2020-10-24: 2 via RESPIRATORY_TRACT
  Filled 2020-10-24: qty 6.7

## 2020-10-24 NOTE — ED Provider Notes (Signed)
[facility] Provider Note: [name], MD, FACEP  CSN: [id number] MRN: [id number] ARRIVAL: 10/23/20 at 2308 ROOM: [facility]06/[facility]06   CHIEF COMPLAINT  Shortness of Breath   HISTORY OF PRESENT ILLNESS  10/24/20 2:34 AM Juan Willis is a 49 y.o. male who has been having shortness of breath since yesterday.  He describes this as difficulty taking a deep breath.  He has had no associated cough, fever or other Covid symptoms.  About an hour prior to arrival he was lying in bed when he felt an exacerbation of this shortness of breath and he felt a pain shoot up his left arm for about a second.  He rates his pain as an 8 out of 10.  Nothing made it better or worse.  He had a similar pain in the left side of his neck yesterday morning.  He cannot reproduce the pain with movement of the neck presently.   Past Medical History:  Diagnosis Date   Asthma    High cholesterol     Past Surgical History:  Procedure Laterality Date   EYE SURGERY      Family History  Problem Relation Age of Onset   Hypertension Neg Hx    Diabetes Neg Hx     Social History   Tobacco Use   Smoking status: Never Smoker   Smokeless tobacco: Never Used  Vaping Use   Vaping Use: Never used  Substance Use Topics   Alcohol use: Yes    Comment: occ   Drug use: No    Prior to Admission medications   Medication Sig Start Date End Date Taking? Authorizing Provider  albuterol (VENTOLIN HFA) 108 (90 Base) MCG/ACT inhaler Inhale 2 puffs into the lungs every 6 (six) hours as needed for wheezing or shortness of breath. 03/31/20 10/24/20  Claiborne Rigg, NP  atorvastatin (LIPITOR) 20 MG tablet Take 1 tablet (20 mg total) by mouth daily. 02/11/20 03/31/20  Claiborne Rigg, NP  omeprazole (PRILOSEC) 20 MG capsule Take 1 capsule (20 mg total) by mouth daily. Patient not taking: Reported on 02/09/2020 03/19/19 03/19/20  Petrucelli, Pleas Koch, PA-C    Allergies Patient has no known allergies.   REVIEW  OF SYSTEMS  Negative except as noted here or in the History of Present Illness.   PHYSICAL EXAMINATION  Initial Vital Signs Blood pressure 111/77, pulse 65, temperature 98.6 F (37 C), temperature source Oral, resp. rate 17, height 5\' 6"  (1.676 m), weight 68 kg, SpO2 95 %.  Examination General: Well-developed, well-nourished male in no acute distress; appearance consistent with age of record HENT: normocephalic; atraumatic Eyes: pupils equal, round and reactive to light; extraocular muscles intact Neck: supple; movement of the neck does not reproduce arm pain Heart: regular rate and rhythm Lungs: clear to auscultation bilaterally Abdomen: soft; nondistended; nontender; bowel sounds present Extremities: No deformity; full range of motion; pulses normal Neurologic: Awake, alert and oriented; motor function intact in all extremities and symmetric; no facial droop Skin: Warm and dry Psychiatric: Normal mood and affect   RESULTS  Summary of this visit's results, reviewed and interpreted by myself:   EKG Interpretation  Date/Time:  Monday October 23 2020 23:14:05 EDT Ventricular Rate:  77 PR Interval:  190 QRS Duration: 88 QT Interval:  362 QTC Calculation: 409 R Axis:   -10 Text Interpretation: Normal sinus rhythm Artifact No significant change was found Confirmed by [id number] (Paula Libra) on 10/23/2020 11:28:36 PM      Laboratory Studies: Results for  orders placed or performed during the hospital encounter of 10/24/20 (from the past 24 hour(s))  Basic metabolic panel     Status: Abnormal   Collection Time: 10/23/20 11:22 PM  Result Value Ref Range   Sodium 138 135 - 145 mmol/L   Potassium 3.9 3.5 - 5.1 mmol/L   Chloride 104 98 - 111 mmol/L   CO2 23 22 - 32 mmol/L   Glucose, Bld 102 (H) 70 - 99 mg/dL   BUN 24 (H) 6 - 20 mg/dL   Creatinine, Ser 9.73 0.61 - 1.24 mg/dL   Calcium 9.1 8.9 - 53.2 mg/dL   GFR, Estimated >99 >24 mL/min   Anion gap 11 5 - 15  CBC     Status:  None   Collection Time: 10/23/20 11:22 PM  Result Value Ref Range   WBC 10.3 4.0 - 10.5 K/uL   RBC 5.46 4.22 - 5.81 MIL/uL   Hemoglobin 16.4 13.0 - 17.0 g/dL   HCT 26.8 39 - 52 %   MCV 85.5 80.0 - 100.0 fL   MCH 30.0 26.0 - 34.0 pg   MCHC 35.1 30.0 - 36.0 g/dL   RDW 34.1 96.2 - 22.9 %   Platelets 218 150 - 400 K/uL   nRBC 0.0 0.0 - 0.2 %  Troponin I (High Sensitivity)     Status: None   Collection Time: 10/23/20 11:22 PM  Result Value Ref Range   Troponin I (High Sensitivity) 3 <18 ng/L  Troponin I (High Sensitivity)     Status: None   Collection Time: 10/24/20  1:39 AM  Result Value Ref Range   Troponin I (High Sensitivity) 2 <18 ng/L   Imaging Studies: DG Chest 2 View  Result Date: 10/23/2020 CLINICAL DATA:  Chest pain EXAM: CHEST - 2 VIEW COMPARISON:  08/02/2020 FINDINGS: Lungs are well expanded, symmetric, and clear. No pneumothorax or pleural effusion. Cardiac size within normal limits. Pulmonary vascularity is normal. Osseous structures are age-appropriate. No acute bone abnormality. IMPRESSION: No active cardiopulmonary disease. Electronically Signed   By: Helyn Numbers MD   On: 10/23/2020 23:34    ED COURSE and MDM  Nursing notes, initial and subsequent vitals signs, including pulse oximetry, reviewed and interpreted by myself.  Vitals:   10/23/20 2316 10/24/20 0143 10/24/20 0144 10/24/20 0250  BP:   111/77   Pulse:   65   Resp:   17   Temp:      TempSrc:      SpO2:  99% 95% 100%  Weight: 68 kg     Height: 5\' 6"  (1.676 m)      Medications  albuterol (VENTOLIN HFA) 108 (90 Base) MCG/ACT inhaler 2 puff (2 puffs Inhalation Given 10/24/20 0249)    3:07 AM Patient able to breathe more deeply after albuterol treatment.  The sharp, rapid pain in his arm (in his neck 2 days ago) sound more like a neurologic etiology from his neck not a cardiac etiology.  He was advised to follow-up with his PCP if symptoms persist.  It was advised that his EKG, chest x-ray and lab work  were reassuring.  PROCEDURES  Procedures   ED DIAGNOSES     ICD-10-CM   1. Shortness of breath  R06.02        13/2/21, MD 10/24/20 [phone]

## 2021-03-30 IMAGING — DX DG CHEST 2V
2 series · 2 of 2 positions shown · non-contrast
Comparison: 08/02/2020

CLINICAL DATA: Chest pain

EXAM:
CHEST - 2 VIEW

[chest pa]
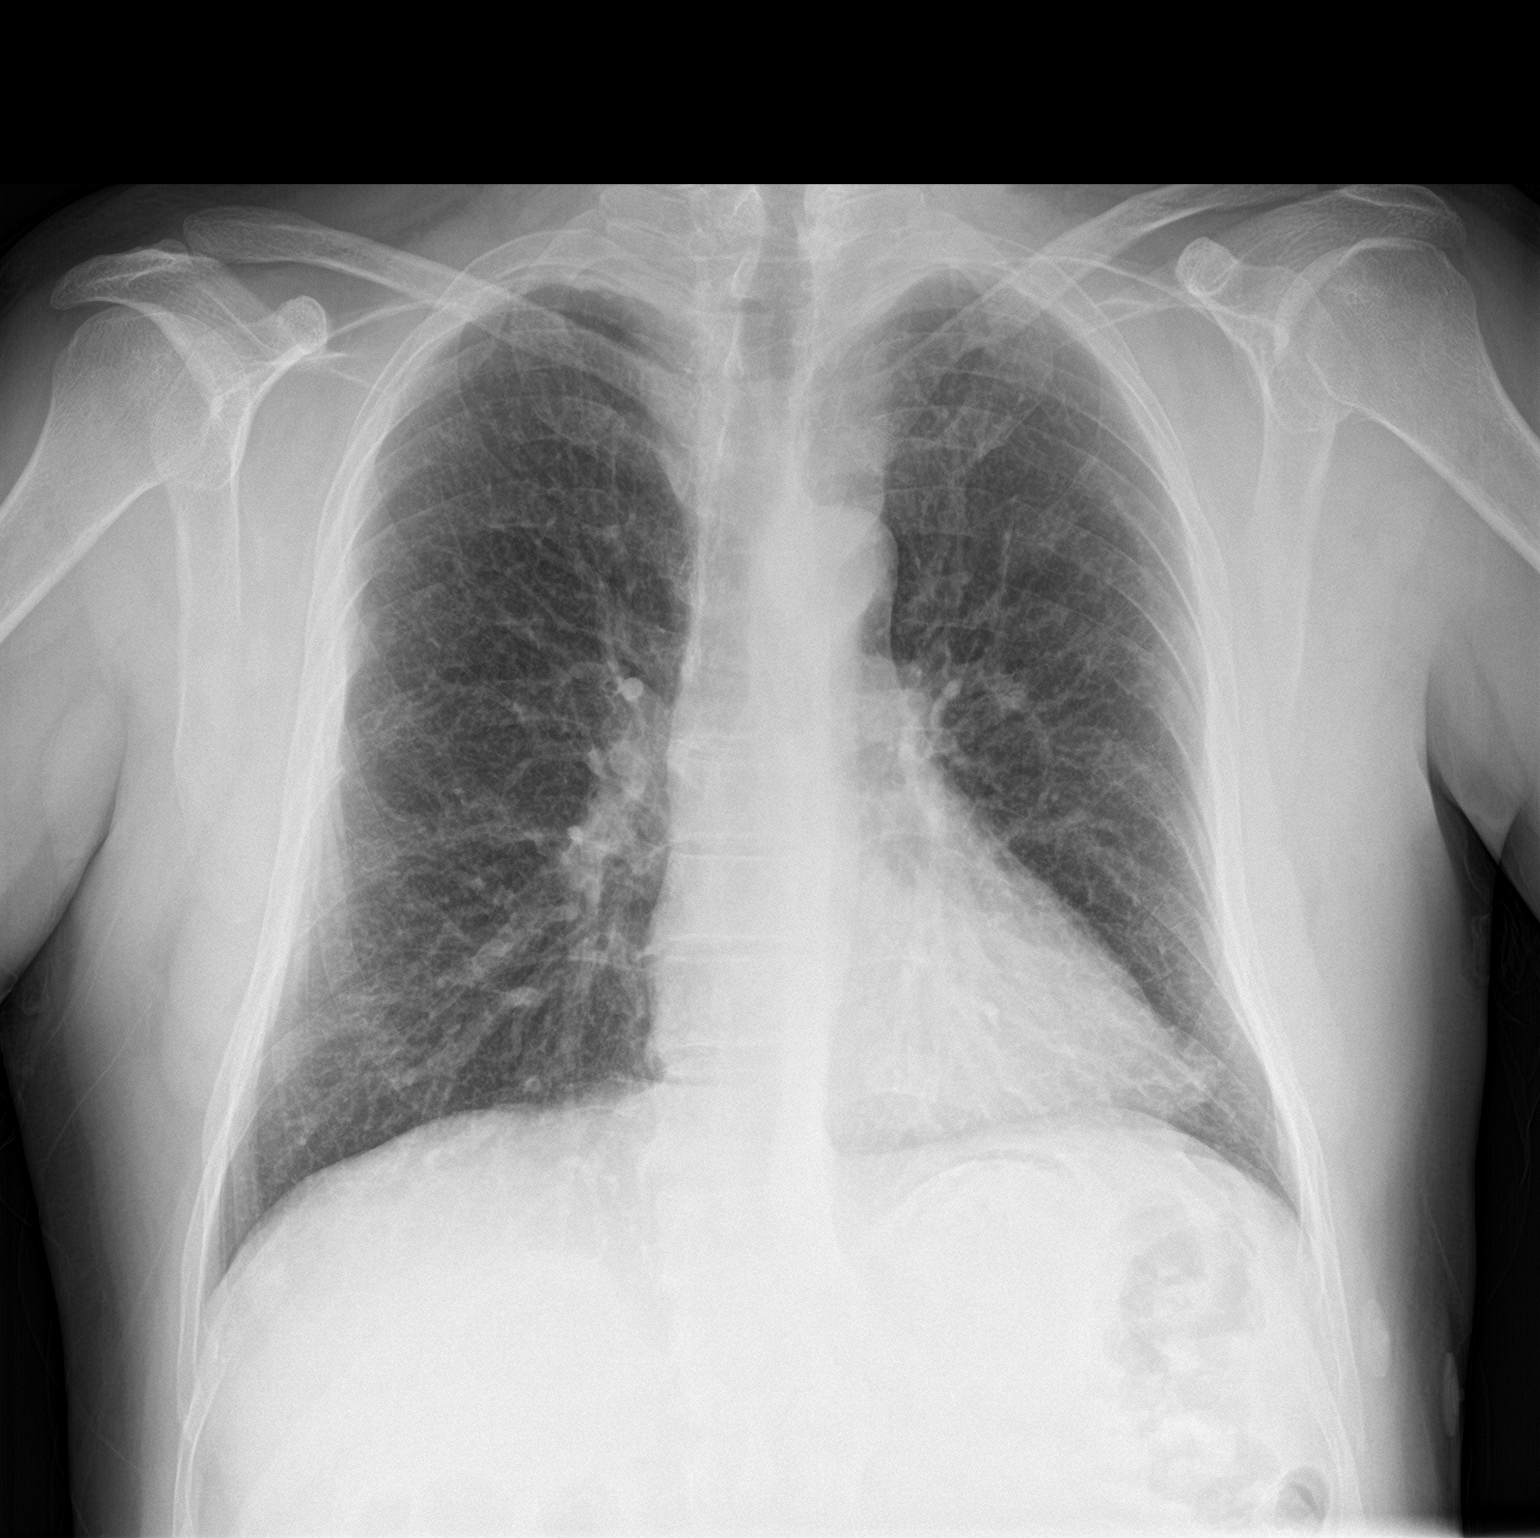

[chest lat]
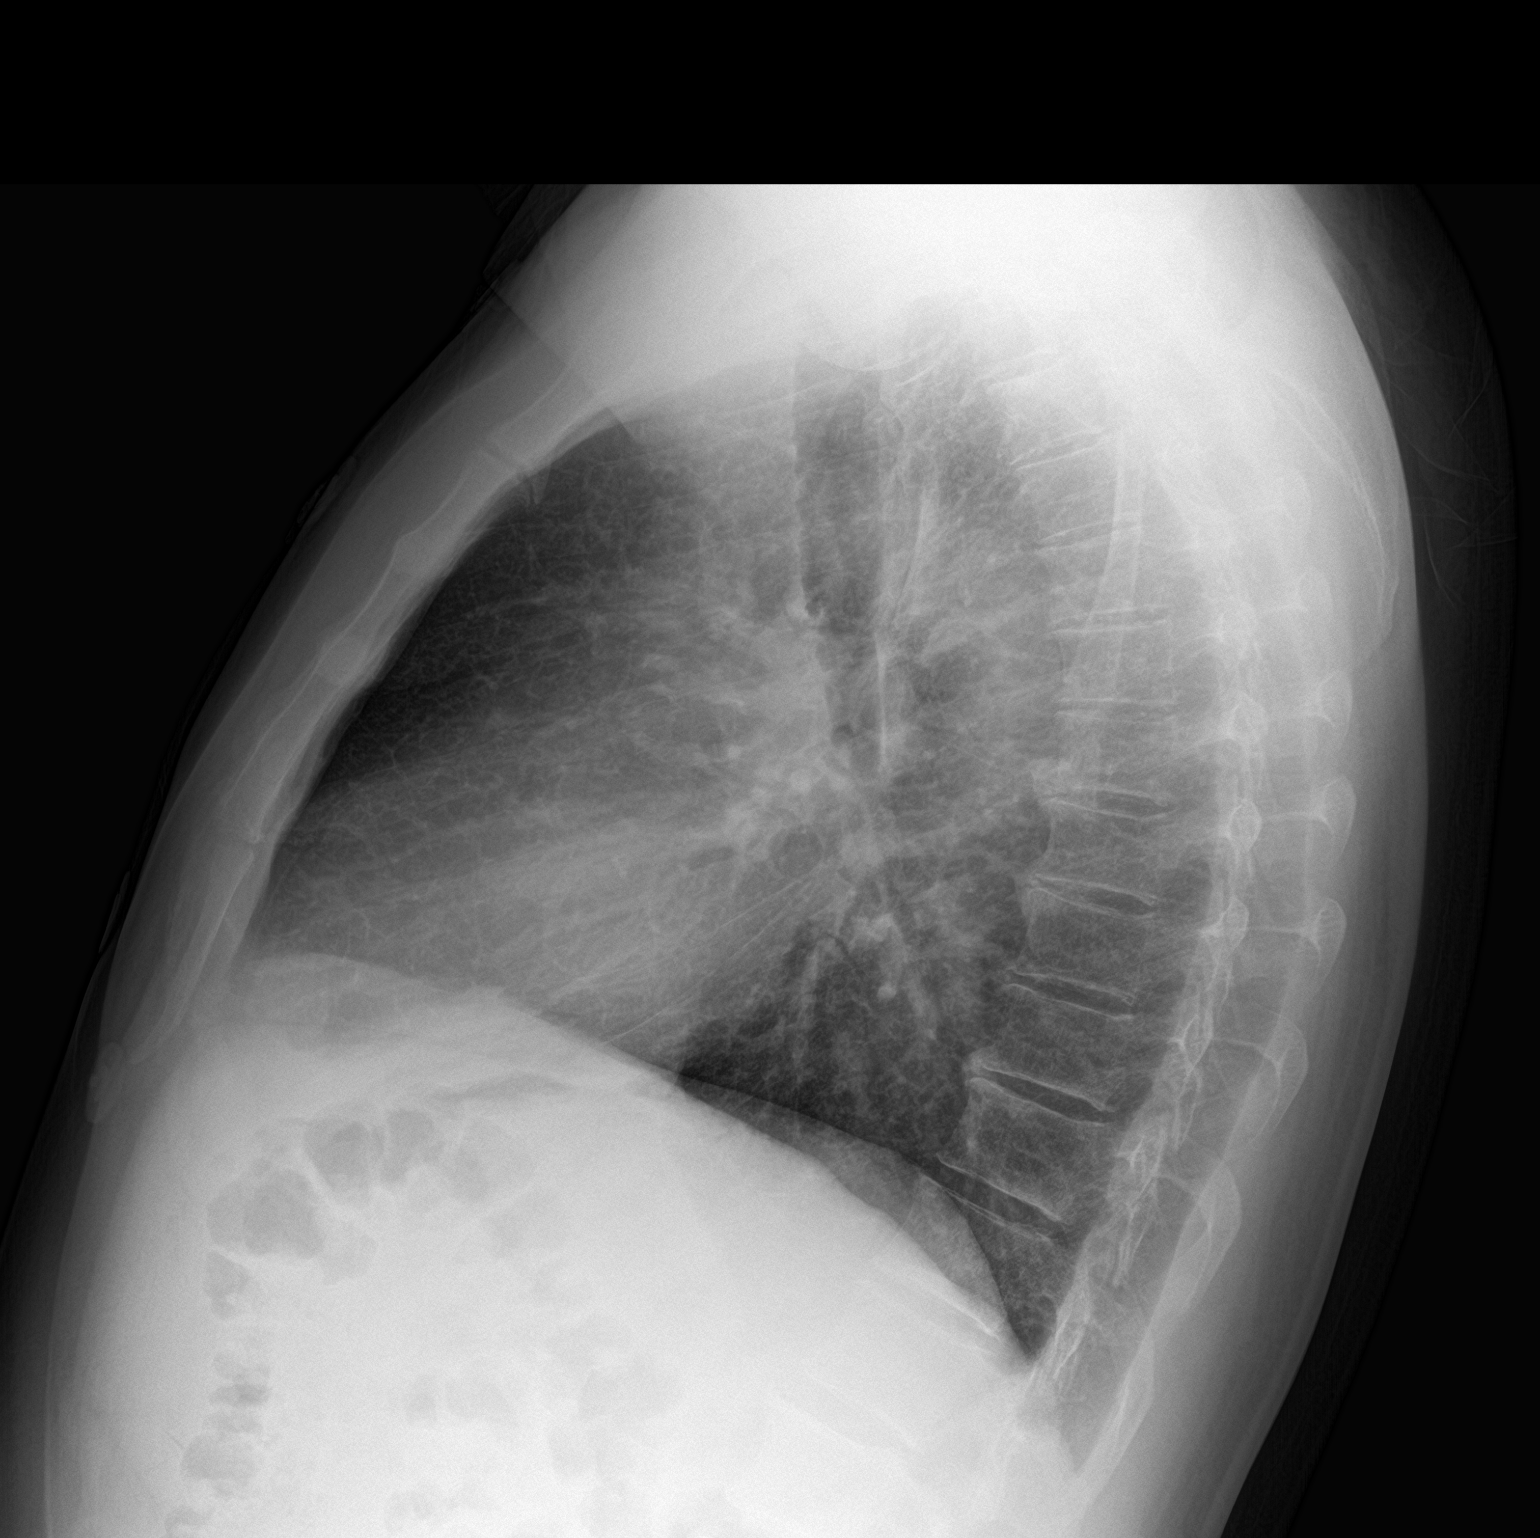

[2 of 2 positions shown; findings below may reference images not displayed]

FINDINGS: Lungs are well expanded, symmetric, and clear. No pneumothorax or
pleural effusion. Cardiac size within normal limits. Pulmonary
vascularity is normal. Osseous structures are age-appropriate. No
acute bone abnormality.
IMPRESSION: No active cardiopulmonary disease.

## 2021-05-07 DIAGNOSIS — Z20822 Contact with and (suspected) exposure to covid-19: Secondary | ICD-10-CM | POA: Diagnosis not present

## 2021-05-09 NOTE — Progress Notes (Signed)
Called patient for preop call and patient stated already had procedure 05/03/21 with another provider.  Called Dr. Randa Evens office and made them aware.

## 2021-05-11 ENCOUNTER — Ambulatory Visit (HOSPITAL_COMMUNITY): Admission: RE | Admit: 2021-05-11 | Payer: Medicaid Other | Source: Home / Self Care | Admitting: Oral Surgery

## 2021-05-11 ENCOUNTER — Encounter (HOSPITAL_COMMUNITY): Admission: RE | Payer: Self-pay | Source: Home / Self Care

## 2021-05-11 SURGERY — DENTAL RESTORATION/EXTRACTIONS
Anesthesia: General | Laterality: Bilateral

## 2021-05-30 ENCOUNTER — Other Ambulatory Visit: Payer: Self-pay

## 2021-05-30 ENCOUNTER — Emergency Department (HOSPITAL_BASED_OUTPATIENT_CLINIC_OR_DEPARTMENT_OTHER)
Admission: EM | Admit: 2021-05-30 | Discharge: 2021-05-30 | Disposition: A | Discharge status: Home / Self Care | Payer: Medicaid Other | Attending: Emergency Medicine | Admitting: Emergency Medicine

## 2021-05-30 ENCOUNTER — Other Ambulatory Visit (HOSPITAL_BASED_OUTPATIENT_CLINIC_OR_DEPARTMENT_OTHER): Payer: Self-pay

## 2021-05-30 ENCOUNTER — Emergency Department (HOSPITAL_BASED_OUTPATIENT_CLINIC_OR_DEPARTMENT_OTHER): Payer: Medicaid Other

## 2021-05-30 ENCOUNTER — Encounter (HOSPITAL_BASED_OUTPATIENT_CLINIC_OR_DEPARTMENT_OTHER): Payer: Self-pay | Admitting: Emergency Medicine

## 2021-05-30 DIAGNOSIS — R0789 Other chest pain: Secondary | ICD-10-CM | POA: Diagnosis not present

## 2021-05-30 DIAGNOSIS — R0602 Shortness of breath: Secondary | ICD-10-CM | POA: Insufficient documentation

## 2021-05-30 DIAGNOSIS — R0989 Other specified symptoms and signs involving the circulatory and respiratory systems: Secondary | ICD-10-CM | POA: Insufficient documentation

## 2021-05-30 DIAGNOSIS — R079 Chest pain, unspecified: Secondary | ICD-10-CM

## 2021-05-30 DIAGNOSIS — J45909 Unspecified asthma, uncomplicated: Secondary | ICD-10-CM | POA: Diagnosis not present

## 2021-05-30 DIAGNOSIS — Z20822 Contact with and (suspected) exposure to covid-19: Secondary | ICD-10-CM | POA: Diagnosis not present

## 2021-05-30 DIAGNOSIS — R0600 Dyspnea, unspecified: Secondary | ICD-10-CM

## 2021-05-30 LAB — BASIC METABOLIC PANEL
Anion gap: 8 (ref 5–15)
BUN: 20 mg/dL (ref 6–20)
CO2: 24 mmol/L (ref 22–32)
Calcium: 9.1 mg/dL (ref 8.9–10.3)
Chloride: 103 mmol/L (ref 98–111)
Creatinine, Ser: 0.89 mg/dL (ref 0.61–1.24)
GFR, Estimated: >60 mL/min (ref >60)
Glucose, Bld: 146 mg/dL — ABNORMAL HIGH (ref 70–99)
Potassium: 3.8 mmol/L (ref 3.5–5.1)
Sodium: 135 mmol/L (ref 135–145)

## 2021-05-30 LAB — CBC
HCT: 44.8 % (ref 39.0–52.0)
Hemoglobin: 15.7 g/dL (ref 13.0–17.0)
MCH: 30.1 pg (ref 26.0–34.0)
MCHC: 35 g/dL (ref 30.0–36.0)
MCV: 86 fL (ref 80.0–100.0)
Platelets: 212 10*3/uL (ref 150–400)
RBC: 5.21 MIL/uL (ref 4.22–5.81)
RDW: 13.1 % (ref 11.5–15.5)
WBC: 10 10*3/uL (ref 4.0–10.5)
nRBC: 0 % (ref 0.0–0.2)

## 2021-05-30 LAB — TROPONIN I (HIGH SENSITIVITY)
Troponin I (High Sensitivity): <2 ng/L (ref <18)
Troponin I (High Sensitivity): <2 ng/L (ref <18)

## 2021-05-30 LAB — RESP PANEL BY RT-PCR (FLU A&B, COVID) ARPGX2
Influenza A by PCR: NEGATIVE
Influenza B by PCR: NEGATIVE
SARS Coronavirus 2 by RT PCR: NEGATIVE

## 2021-05-30 LAB — D-DIMER, QUANTITATIVE: D-Dimer, Quant: 0.41 ug/mL-FEU (ref 0.00–0.50)

## 2021-05-30 MED ORDER — IPRATROPIUM-ALBUTEROL 0.5-2.5 (3) MG/3ML IN SOLN
3.0000 mL | Freq: Once | RESPIRATORY_TRACT | Status: AC
  Administered 2021-05-30: 3 mL via RESPIRATORY_TRACT
  Filled 2021-05-30: qty 3

## 2021-05-30 MED ORDER — KETOROLAC TROMETHAMINE 30 MG/ML IJ SOLN
30.0000 mg | Freq: Once | INTRAMUSCULAR | Status: AC
  Administered 2021-05-30: 30 mg via INTRAVENOUS
  Filled 2021-05-30: qty 1

## 2021-05-30 MED ORDER — ALBUTEROL SULFATE HFA 108 (90 BASE) MCG/ACT IN AERS
2.0000 | INHALATION_SPRAY | Freq: Once | RESPIRATORY_TRACT | Status: AC
  Administered 2021-05-30: 2 via RESPIRATORY_TRACT
  Filled 2021-05-30: qty 6.7

## 2021-05-30 MED ORDER — METHYLPREDNISOLONE 4 MG PO TBPK
ORAL_TABLET | Freq: Every day | ORAL | 0 refills | Status: DC
  Filled 2021-05-30: qty 21, 6d supply, fill #0

## 2021-05-30 MED ORDER — DEXAMETHASONE SODIUM PHOSPHATE 10 MG/ML IJ SOLN
10.0000 mg | Freq: Once | INTRAMUSCULAR | Status: AC
  Administered 2021-05-30: 10 mg via INTRAVENOUS
  Filled 2021-05-30: qty 1

## 2021-05-30 NOTE — ED Provider Notes (Signed)
[facility] EMERGENCY DEPARTMENT Provider Note   CSN: [id number] Arrival date & time: 05/30/21  0962     History Chief Complaint  Patient presents with  . Chest Pain  . Shortness of Breath    Juan Willis is a 50 y.o. male.  Pt presents to the ED today with cp and sob.  The pt mainly c/o sob.  He said he noticed it a little around 1900 yesterday evening.  He said he went to bed around 2300, but was unable to lie flat.  He feels like someone is strangling his throat.  He can't describe the cp.  No cough.  No f/c.  No n/v.         Past Medical History:  Diagnosis Date  . Asthma   . High cholesterol     Patient Active Problem List   Diagnosis Date Noted  . Neck pain 04/14/2017    Past Surgical History:  Procedure Laterality Date  . EYE SURGERY         Family History  Problem Relation Age of Onset  . Hypertension Neg Hx   . Diabetes Neg Hx     Social History   Tobacco Use  . Smoking status: Never Smoker  . Smokeless tobacco: Never Used  Vaping Use  . Vaping Use: Never used  Substance Use Topics  . Alcohol use: Yes    Comment: occ  . Drug use: No    Home Medications Prior to Admission medications   Medication Sig Start Date End Date Taking? Authorizing Provider  methylPREDNISolone (MEDROL DOSEPAK) 4 MG TBPK tablet Take by mouth daily. Day 1:  2 pills at breakfast, 1 pill at lunch, 1 pill after supper, 2 pills at bedtime;Day 2:  1 pill at breakfast, 1 pill at lunch, 1 pill after supper, 2 pills at bedtime;Day 3:  1 pill at breakfast, 1 pill at lunch, 1 pill after supper, 1 pill at bedtime;Day 4:  1 pill at breakfast, 1 pill at lunch, 1 pill at bedtime;Day 5:  1 pill at breakfast, 1 pill at bedtime;Day 6:  1 pill at breakfast 05/30/21  Yes Jacalyn Lefevre, MD  albuterol (VENTOLIN HFA) 108 (90 Base) MCG/ACT inhaler Inhale 2 puffs into the lungs every 6 (six) hours as needed for wheezing or shortness of breath. 03/31/20 10/24/20  Claiborne Rigg, NP   atorvastatin (LIPITOR) 20 MG tablet Take 1 tablet (20 mg total) by mouth daily. 02/11/20 03/31/20  Claiborne Rigg, NP  omeprazole (PRILOSEC) 20 MG capsule Take 1 capsule (20 mg total) by mouth daily. Patient not taking: Reported on 02/09/2020 03/19/19 03/19/20  Petrucelli, Pleas Koch, PA-C    Allergies    Patient has no known allergies.  Review of Systems   Review of Systems  Respiratory: Positive for shortness of breath.   Cardiovascular: Positive for chest pain.  All other systems reviewed and are negative.   Physical Exam Updated Vital Signs BP 114/78   Pulse 68   Temp 98 F (36.7 C) (Oral)   Resp 11   Ht 5\' 6"  (1.676 m)   Wt 72.6 kg   SpO2 98%   BMI 25.82 kg/m   Physical Exam Vitals and nursing note reviewed.  Constitutional:      Appearance: He is well-developed.  HENT:     Head: Normocephalic and atraumatic.  Eyes:     Extraocular Movements: Extraocular movements intact.     Pupils: Pupils are equal, round, and reactive to light.  Cardiovascular:  Rate and Rhythm: Normal rate and regular rhythm.     Heart sounds: Normal heart sounds.  Pulmonary:     Effort: Pulmonary effort is normal.     Breath sounds: Normal breath sounds.  Abdominal:     General: Bowel sounds are normal.     Palpations: Abdomen is soft.  Musculoskeletal:        General: Normal range of motion.     Cervical back: Normal range of motion and neck supple.  Skin:    General: Skin is warm.     Capillary Refill: Capillary refill takes less than 2 seconds.  Neurological:     General: No focal deficit present.     Mental Status: He is alert and oriented to person, place, and time.  Psychiatric:        Mood and Affect: Mood normal.        Behavior: Behavior normal.     ED Results / Procedures / Treatments   Labs (all labs ordered are listed, but only abnormal results are displayed) Labs Reviewed  BASIC METABOLIC PANEL - Abnormal; Notable for the following components:      Result Value    Glucose, Bld 146 (*)    All other components within normal limits  RESP PANEL BY RT-PCR (FLU A&B, COVID) [id number]  CBC  D-DIMER, QUANTITATIVE  TROPONIN I (HIGH SENSITIVITY)  TROPONIN I (HIGH SENSITIVITY)    EKG EKG Interpretation  Date/Time:  Wednesday May 30 2021 02:37:11 EDT Ventricular Rate:  67 PR Interval:  189 QRS Duration: 99 QT Interval:  387 QTC Calculation: 409 R Axis:   44 Text Interpretation: Sinus rhythm Abnormal R-wave progression, early transition Minimal ST elevation, inferior leads No significant change since last tracing Confirmed by Jacalyn Lefevre [phone]) on 05/30/2021 2:40:29 AM   Radiology DG Chest Port 1 View  Result Date: 05/30/2021 CLINICAL DATA:  Chest pain, dyspnea EXAM: PORTABLE CHEST 1 VIEW COMPARISON:  10/23/2020 FINDINGS: The lungs are symmetrically well expanded. Coarsening of the pulmonary interstitium is again seen, stable since prior examination. No superimposed confluent pulmonary infiltrate. No pneumothorax or pleural effusion. Cardiac size within normal limits. The pulmonary vascularity is normal. No acute bone abnormality. IMPRESSION: Stable chronic changes.  No acute cardiopulmonary disease. Electronically Signed   By: Helyn Numbers MD   On: 05/30/2021 03:33    Procedures Procedures   Medications Ordered in ED Medications  dexamethasone (DECADRON) injection 10 mg (10 mg Intravenous Given 05/30/21 0344)  albuterol (VENTOLIN HFA) 108 (90 Base) MCG/ACT inhaler 2 puff (2 puffs Inhalation Given 05/30/21 0350)  ketorolac (TORADOL) 30 MG/ML injection 30 mg (30 mg Intravenous Given 05/30/21 0435)  ipratropium-albuterol (DUONEB) 0.5-2.5 (3) MG/3ML nebulizer solution 3 mL (3 mLs Nebulization Given 05/30/21 1941)    ED Course  I have reviewed the triage vital signs and the nursing notes.  Pertinent labs & imaging results that were available during my care of the patient were reviewed by me and considered in my medical decision making (see chart for  details).    MDM Rules/Calculators/A&P                          No evidence of CAD and Ddimer neg.  CXR is clear.  He is feeling better after decadron and albuterol.  He is given a duoneb and that has helped a lot.  Pt has been here a few times for sob.  He has never seen a pulmonologist.  Pt is  given a referral.  He is to return if worse.  Final Clinical Impression(s) / ED Diagnoses Final diagnoses:  Atypical chest pain  Dyspnea, unspecified type    Rx / DC Orders ED Discharge Orders         Ordered    Ambulatory referral to Pulmonology        05/30/21 0630    methylPREDNISolone (MEDROL DOSEPAK) 4 MG TBPK tablet  Daily        05/30/21 0630           Jacalyn Lefevre, MD 05/30/21 [phone]

## 2021-05-30 NOTE — ED Triage Notes (Signed)
Chest pain and shortness of breath started at 7 pm yesterday but has gotten worse, unable to sleep.

## 2021-05-31 ENCOUNTER — Telehealth: Payer: Self-pay

## 2021-05-31 NOTE — Telephone Encounter (Signed)
Transition Care Management Follow-up Telephone Call Date of discharge and from where: 05/30/2021 from [facility] How have you been since you were released from the hospital? Pt stated that he is feeling better and will be able to pick up his medication today from [facility].  Any questions or concerns? No  Items Reviewed: Did the pt receive and understand the discharge instructions provided? Yes  Medications obtained and verified? Yes  Other? No  Any new allergies since your discharge? No  Dietary orders reviewed? No Do you have support at home? Yes   Functional Questionnaire: (I = Independent and D = Dependent) ADLs: I  Bathing/Dressing- I  Meal Prep- I  Eating- I  Maintaining continence- I  Transferring/Ambulation- I  Managing Meds- I  Follow up appointments reviewed:  PCP Hospital f/u appt confirmed? No  Pt will call PCP to schedule an appt.  Specialist Hospital f/u appt confirmed? No   Are transportation arrangements needed? No  If their condition worsens, is the pt aware to call PCP or go to the Emergency Dept.? No Was the patient provided with contact information for the PCP's office or ED? No Was to pt encouraged to call back with questions or concerns? No

## 2021-06-08 ENCOUNTER — Other Ambulatory Visit (HOSPITAL_BASED_OUTPATIENT_CLINIC_OR_DEPARTMENT_OTHER): Payer: Self-pay

## 2021-07-17 ENCOUNTER — Institutional Professional Consult (permissible substitution): Payer: Medicaid Other | Admitting: Pulmonary Disease

## 2021-07-24 ENCOUNTER — Ambulatory Visit: Payer: Medicaid Other | Admitting: Nurse Practitioner

## 2021-08-14 ENCOUNTER — Other Ambulatory Visit: Payer: Self-pay

## 2021-08-14 ENCOUNTER — Ambulatory Visit: Payer: Medicaid Other | Admitting: Pulmonary Disease

## 2021-08-14 ENCOUNTER — Encounter: Payer: Self-pay | Admitting: Pulmonary Disease

## 2021-08-14 VITALS — BP 116/72 | HR 72 | Temp 98.4°F | Ht 66.0 in | Wt 184.0 lb

## 2021-08-14 DIAGNOSIS — J4520 Mild intermittent asthma, uncomplicated: Secondary | ICD-10-CM | POA: Diagnosis not present

## 2021-08-14 MED ORDER — DULERA 200-5 MCG/ACT IN AERO: 2.0000 | INHALATION_SPRAY | Freq: Two times a day (BID) | RESPIRATORY_TRACT | 6 refills | Status: DC | PRN

## 2021-08-14 MED ORDER — ALBUTEROL SULFATE HFA 108 (90 BASE) MCG/ACT IN AERS
2.0000 | INHALATION_SPRAY | Freq: Four times a day (QID) | RESPIRATORY_TRACT | 2 refills | Status: DC | PRN
Start: 2021-08-14 — End: 2022-01-23

## 2021-08-14 NOTE — Patient Instructions (Signed)
Use dulera inhaler 2 puffs twice daily as needed for shortness of breath and chest tightness - rinse mouth out after each use  You can use albuterol inhaler 1-2 puffs as needed every 4-6 hours while using the dulera inhaler.  Please call our office if these inhalers do not help your breathing during times of shortness of breath/chest discomfort and we will send in a prednisone prescription.  We will discuss an allergy medication to use for your asthma at the next follow up visit called montelukast.

## 2021-08-14 NOTE — Progress Notes (Signed)
Synopsis: Referred in August 2022 for Asthma by Jacalyn Lefevre, MD  Subjective:   PATIENT ID: Juan Willis GENDER: male DOB: Dec 19, 1971, MRN: [id number]   HPI  Chief Complaint  Patient presents with   Consult    Referred from the ED for SOB. Has noticed the increase of the past 3-4 months.    Juan Willis is a 50 year old male, never smoker with hyperlipidemia and asthma who is referred to pulmonary clinic for evaluation of asthma.   He had an ER visit 05/30/21 for chest pain and shortness of breath, where he was treated with decadron and albuterol nebulizer treatment with improvement.  He has not had any issues with his breathing since this visit.  He has had a total of 4 ER visits since the beginning of 2021 for his breathing.  He reports intermittent episodes with acute onset of shortness of breath and chest tightness.  He does have issues with seasonal allergies mostly due to the spring pollen where this can affect his breathing and lead to chest tightness.  He has issues with sneezing during the fall season.  He denies any issues in his breathing from cold air.  He does notice that cigarette smoke and strong scents can bother his breathing.  He reports having a history of asthma but has never required a maintenance inhaler.  He denies issues with sinus congestion and postnasal drainage.  He denies any issues with heartburn or reflux.  He works as a Landscape architect.  He denies any significant dust or chemical exposures.   Past Medical History:  Diagnosis Date   Asthma    High cholesterol      Family History  Problem Relation Age of Onset   Hypertension Neg Hx    Diabetes Neg Hx      Social History   Socioeconomic History   Marital status: Married    Spouse name: Not on file   Number of children: Not on file   Years of education: Not on file   Highest education level: Not on file  Occupational History   Not on file  Tobacco Use   Smoking status: Never    Smokeless tobacco: Never  Vaping Use   Vaping Use: Never used  Substance and Sexual Activity   Alcohol use: Yes    Comment: occ   Drug use: No   Sexual activity: Yes  Other Topics Concern   Not on file  Social History Narrative   Not on file   Social Determinants of Health   Financial Resource Strain: Not on file  Food Insecurity: Not on file  Transportation Needs: Not on file  Physical Activity: Not on file  Stress: Not on file  Social Connections: Not on file  Intimate Partner Violence: Not on file     No Known Allergies   Outpatient Medications Prior to Visit  Medication Sig Dispense Refill   methylPREDNISolone (MEDROL DOSEPAK) 4 MG TBPK tablet Take by mouth daily. Day 1:  2 pills at breakfast, 1 pill at lunch, 1 pill after supper, 2 pills at bedtime;Day 2:  1 pill at breakfast, 1 pill at lunch, 1 pill after supper, 2 pills at bedtime;Day 3:  1 pill at breakfast, 1 pill at lunch, 1 pill after supper, 1 pill at bedtime;Day 4:  1 pill at breakfast, 1 pill at lunch, 1 pill at bedtime;Day 5:  1 pill at breakfast, 1 pill at bedtime;Day 6:  1 pill at breakfast 21 tablet 0  No facility-administered medications prior to visit.    Review of Systems  Constitutional:  Negative for chills, fever, malaise/fatigue and weight loss.  HENT:  Negative for congestion, sinus pain and sore throat.   Eyes: Negative.   Respiratory:  Positive for shortness of breath. Negative for cough, hemoptysis, sputum production and wheezing.   Cardiovascular:  Positive for chest pain. Negative for palpitations, orthopnea, claudication and leg swelling.  Gastrointestinal:  Negative for abdominal pain, heartburn, nausea and vomiting.  Genitourinary: Negative.   Musculoskeletal:  Negative for joint pain and myalgias.  Skin:  Negative for rash.  Neurological:  Negative for weakness.  Endo/Heme/Allergies: Negative.   Psychiatric/Behavioral: Negative.     Objective:   Vitals:   08/14/21 0913  BP: 116/72   Pulse: 72  Temp: 98.4 F (36.9 C)  TempSrc: Oral  SpO2: 99%  Weight: 83.5 kg  Height: 5\' 6"  (1.676 m)     Physical Exam Constitutional:      General: He is not in acute distress. HENT:     Head: Normocephalic and atraumatic.     Nose: Nose normal.     Mouth/Throat:     Mouth: Mucous membranes are moist.     Pharynx: Oropharynx is clear.  Eyes:     Extraocular Movements: Extraocular movements intact.     Conjunctiva/sclera: Conjunctivae normal.     Pupils: Pupils are equal, round, and reactive to light.  Cardiovascular:     Rate and Rhythm: Normal rate and regular rhythm.     Pulses: Normal pulses.     Heart sounds: Normal heart sounds. No murmur heard. Pulmonary:     Effort: Pulmonary effort is normal.     Breath sounds: No wheezing, rhonchi or rales.  Abdominal:     General: Bowel sounds are normal.     Palpations: Abdomen is soft.  Musculoskeletal:     Right lower leg: No edema.     Left lower leg: No edema.  Lymphadenopathy:     Cervical: No cervical adenopathy.  Skin:    General: Skin is warm and dry.  Neurological:     General: No focal deficit present.     Mental Status: He is alert.  Psychiatric:        Mood and Affect: Mood normal.        Behavior: Behavior normal.        Thought Content: Thought content normal.        Judgment: Judgment normal.   CBC    Component Value Date/Time   WBC 10.0 05/30/2021 0238   RBC 5.21 05/30/2021 0238   HGB 15.7 05/30/2021 0238   HGB 17.3 02/09/2020 1018   HCT 44.8 05/30/2021 0238   HCT 51.0 02/09/2020 1018   PLT 212 05/30/2021 0238   PLT 235 02/09/2020 1018   MCV 86.0 05/30/2021 0238   MCV 86 02/09/2020 1018   MCH 30.1 05/30/2021 0238   MCHC 35.0 05/30/2021 0238   RDW 13.1 05/30/2021 0238   RDW 12.8 02/09/2020 1018   LYMPHSABS 4.4 (H) 03/19/2020 0328   MONOABS 0.9 03/19/2020 0328   EOSABS 0.4 03/19/2020 0328   BASOSABS 0.1 03/19/2020 0328   BMP Latest Ref Rng & Units 05/30/2021 10/23/2020 08/02/2020   Glucose 70 - 99 mg/dL 10/02/2020) 229(N) 989(Q)  BUN 6 - 20 mg/dL 20 119(E) 15  Creatinine 0.61 - 1.24 mg/dL 17(E 0.81 4.48  BUN/Creat Ratio 9 - 20 - - -  Sodium 135 - 145 mmol/L 135 138 134(L)  Potassium  3.5 - 5.1 mmol/L 3.8 3.9 3.5  Chloride 98 - 111 mmol/L 103 104 102  CO2 22 - 32 mmol/L 24 23 24   Calcium 8.9 - 10.3 mg/dL 9.1 9.1 8.9   Chest imaging: CXR 05/30/21 The lungs are symmetrically well expanded. Coarsening of the pulmonary interstitium is again seen, stable since prior examination. No superimposed confluent pulmonary infiltrate. No pneumothorax or pleural effusion. Cardiac size within normal limits. The pulmonary vascularity is normal. No acute bone abnormality.  PFT: No flowsheet data found.  EKG 05/30/21 Sinus rhythm, normal intervals. Minimal ST elevation, inferior leads.     Assessment & Plan:   Mild intermittent asthma in adult without complication - Plan: mometasone-formoterol (DULERA) 200-5 MCG/ACT AERO, albuterol (VENTOLIN HFA) 108 (90 Base) MCG/ACT inhaler  Discussion: Moishy Laday is a 50 year old male, never smoker with hyperlipidemia and asthma who is referred to pulmonary clinic for evaluation of asthma.   He appears to have mild intermittent asthma with triggers that include spring allergies, cigarette smoke and strong scents.   He is to start dulera 200-47mcg 2 puffs twice daily as needed for his symptoms. He can continue to use albuterol inhaler 1-2 puffs as needed every 4-6 hours on top of the dulera during sympomatic periods.   He is to follow up in 6 months prior to the spring season and we will consider adding montelukast to his regimen.  07-14-1991, MD [facility] Office: [phone]   Current Outpatient Medications:    albuterol (VENTOLIN HFA) 108 (90 Base) MCG/ACT inhaler, Inhale 2 puffs into the lungs every 6 (six) hours as needed for wheezing or shortness of breath., Disp: 8 g, Rfl: 2   mometasone-formoterol (DULERA)  200-5 MCG/ACT AERO, Inhale 2 puffs into the lungs 2 (two) times daily as needed for wheezing., Disp: 1 each, Rfl: 6

## 2021-08-15 ENCOUNTER — Encounter: Payer: Self-pay | Admitting: Pulmonary Disease

## 2021-11-04 IMAGING — DX DG CHEST 1V PORT
1 series · 1 of 1 positions shown · non-contrast
Comparison: 10/23/2020

CLINICAL DATA: Chest pain, dyspnea

EXAM:
PORTABLE CHEST 1 VIEW

[chest ap]
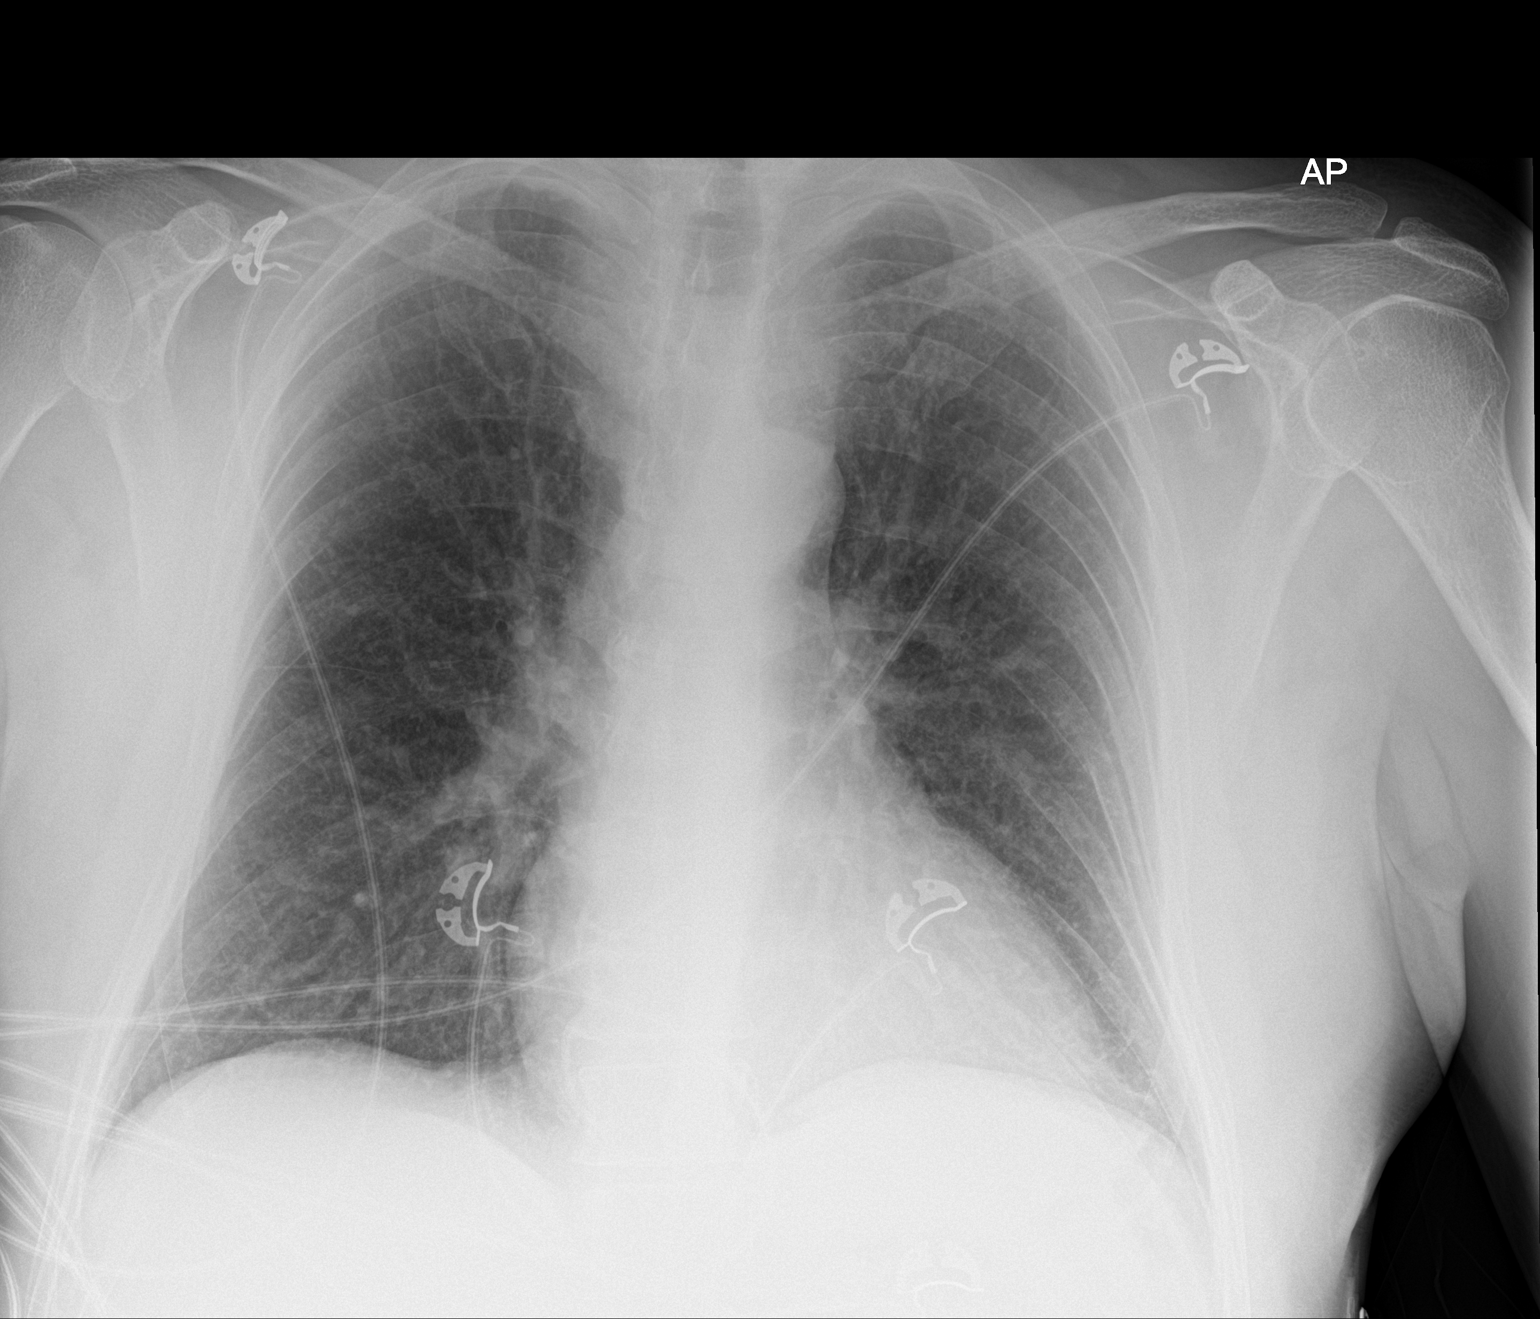

[1 of 1 positions shown; findings below may reference images not displayed]

FINDINGS: The lungs are symmetrically well expanded. Coarsening of the
pulmonary interstitium is again seen, stable since prior
examination. No superimposed confluent pulmonary infiltrate. No
pneumothorax or pleural effusion. Cardiac size within normal limits.
The pulmonary vascularity is normal. No acute bone abnormality.
IMPRESSION: Stable chronic changes.  No acute cardiopulmonary disease.

## 2022-01-23 ENCOUNTER — Ambulatory Visit (INDEPENDENT_AMBULATORY_CARE_PROVIDER_SITE_OTHER): Payer: Medicaid Other | Admitting: Pulmonary Disease

## 2022-01-23 ENCOUNTER — Encounter: Payer: Self-pay | Admitting: Pulmonary Disease

## 2022-01-23 ENCOUNTER — Other Ambulatory Visit: Payer: Self-pay

## 2022-01-23 VITALS — BP 118/72 | HR 63 | Ht 66.0 in | Wt 184.4 lb

## 2022-01-23 DIAGNOSIS — J4520 Mild intermittent asthma, uncomplicated: Secondary | ICD-10-CM

## 2022-01-23 MED ORDER — ALBUTEROL SULFATE HFA 108 (90 BASE) MCG/ACT IN AERS: 2.0000 | INHALATION_SPRAY | Freq: Four times a day (QID) | RESPIRATORY_TRACT | 2 refills | Status: DC | PRN

## 2022-01-23 MED ORDER — DULERA 200-5 MCG/ACT IN AERO: 2.0000 | INHALATION_SPRAY | Freq: Two times a day (BID) | RESPIRATORY_TRACT | 6 refills | Status: DC | PRN

## 2022-01-23 NOTE — Progress Notes (Signed)
Synopsis: Referred in August 2022 for Asthma by Jacalyn Lefevre, MD  Subjective:   PATIENT ID: Juan Willis, Juan Willis  HPI  Chief Complaint  Patient presents with   Follow-up    6 mo f/u for asthma.    Juan Willis is a 51 year old male, never smoker with hyperlipidemia and asthma who returns to pulmonary clinic for evaluation of asthma.   He was started on dulera 200-73mcg 2 puffs twice daily at last visit but is using it currently as needed. He is using it 2 times per week and albuterol about 1 time per week. He continues to have intermittent episodes of shortness of breath, chest tightness and wheezing.  OV 08/14/21 He had an ER visit 05/30/21 for chest pain and shortness of breath, where he was treated with decadron and albuterol nebulizer treatment with improvement.  He has not had any issues with his breathing since this visit.  He has had a total of 4 ER visits since the beginning of 2021 for his breathing.  He reports intermittent episodes with acute onset of shortness of breath and chest tightness.  He does have issues with seasonal allergies mostly due to the spring pollen where this can affect his breathing and lead to chest tightness.  He has issues with sneezing during the fall season.  He denies any issues in his breathing from cold air.  He does notice that cigarette smoke and strong scents can bother his breathing.  He reports having a history of asthma but has never required a maintenance inhaler.  He denies issues with sinus congestion and postnasal drainage.  He denies any issues with heartburn or reflux.  He works as a Landscape architect.  He denies any significant dust or chemical exposures.   Past Medical History:  Diagnosis Date   Asthma    High cholesterol      Family History  Problem Relation Age of Onset   Hypertension Neg Hx    Diabetes Neg Hx      Social History   Socioeconomic History   Marital status: Married     Spouse name: Not on file   Number of children: Not on file   Years of education: Not on file   Highest education level: Not on file  Occupational History   Not on file  Tobacco Use   Smoking status: Never   Smokeless tobacco: Never  Vaping Use   Vaping Use: Never used  Substance and Sexual Activity   Alcohol use: Yes    Comment: occ   Drug use: No   Sexual activity: Yes  Other Topics Concern   Not on file  Social History Narrative   Not on file   Social Determinants of Health   Financial Resource Strain: Not on file  Food Insecurity: Not on file  Transportation Needs: Not on file  Physical Activity: Not on file  Stress: Not on file  Social Connections: Not on file  Intimate Partner Violence: Not on file     No Known Allergies   Outpatient Medications Prior to Visit  Medication Sig Dispense Refill   albuterol (VENTOLIN HFA) 108 (90 Base) MCG/ACT inhaler Inhale 2 puffs into the lungs every 6 (six) hours as needed for wheezing or shortness of breath. 8 g 2   mometasone-formoterol (DULERA) 200-5 MCG/ACT AERO Inhale 2 puffs into the lungs 2 (two) times daily as needed for wheezing. 1 each 6   No facility-administered medications prior  to visit.   Review of Systems  Constitutional:  Negative for chills, fever, malaise/fatigue and weight loss.  HENT:  Negative for congestion, sinus pain and sore throat.   Eyes: Negative.   Respiratory:  Positive for shortness of breath. Negative for cough, hemoptysis, sputum production and wheezing.   Cardiovascular:  Positive for chest pain. Negative for palpitations, orthopnea, claudication and leg swelling.  Gastrointestinal:  Negative for abdominal pain, heartburn, nausea and vomiting.  Genitourinary: Negative.   Musculoskeletal:  Negative for joint pain and myalgias.  Skin:  Negative for rash.  Neurological:  Negative for weakness.  Endo/Heme/Allergies: Negative.   Psychiatric/Behavioral: Negative.     Objective:   Vitals:    01/23/22 1007  BP: 118/72  Pulse: 63  SpO2: 98%  Weight: 184 lb 6.4 oz (83.6 kg)  Height: 5\' 6"  (1.676 m)   Physical Exam Constitutional:      General: He is not in acute distress. HENT:     Head: Normocephalic and atraumatic.     Nose: Nose normal.     Mouth/Throat:     Mouth: Mucous membranes are moist.     Pharynx: Oropharynx is clear.  Eyes:     Conjunctiva/sclera: Conjunctivae normal.  Cardiovascular:     Rate and Rhythm: Normal rate and regular rhythm.     Pulses: Normal pulses.     Heart sounds: Normal heart sounds. No murmur heard. Pulmonary:     Effort: Pulmonary effort is normal.     Breath sounds: No wheezing, rhonchi or rales.  Musculoskeletal:     Right lower leg: No edema.     Left lower leg: No edema.  Skin:    General: Skin is warm and dry.  Neurological:     General: No focal deficit present.     Mental Status: He is alert.  Psychiatric:        Mood and Affect: Mood normal.        Behavior: Behavior normal.        Thought Content: Thought content normal.        Judgment: Judgment normal.   CBC    Component Value Date/Time   WBC 10.0 05/30/2021 0238   RBC 5.21 05/30/2021 0238   HGB 15.7 05/30/2021 0238   HGB 17.3 02/09/2020 1018   HCT 44.8 05/30/2021 0238   HCT 51.0 02/09/2020 1018   PLT 212 05/30/2021 0238   PLT 235 02/09/2020 1018   MCV 86.0 05/30/2021 0238   MCV 86 02/09/2020 1018   MCH 30.1 05/30/2021 0238   MCHC 35.0 05/30/2021 0238   RDW 13.1 05/30/2021 0238   RDW 12.8 02/09/2020 1018   LYMPHSABS 4.4 (H) 03/19/2020 0328   MONOABS 0.9 03/19/2020 0328   EOSABS 0.4 03/19/2020 0328   BASOSABS 0.1 03/19/2020 0328   BMP Latest Ref Rng & Units 05/30/2021 10/23/2020 08/02/2020  Glucose 70 - 99 mg/dL 10/02/2020) 846(N) 629(B)  BUN 6 - 20 mg/dL 20 284(X) 15  Creatinine 0.61 - 1.24 mg/dL 32(G 4.01 0.27  BUN/Creat Ratio 9 - 20 - - -  Sodium 135 - 145 mmol/L 135 138 134(L)  Potassium 3.5 - 5.1 mmol/L 3.8 3.9 3.5  Chloride 98 - 111 mmol/L 103 104  102  CO2 22 - 32 mmol/L 24 23 24   Calcium 8.9 - 10.3 mg/dL 9.1 9.1 8.9   Chest imaging: CXR 05/30/21 The lungs are symmetrically well expanded. Coarsening of the pulmonary interstitium is again seen, stable since prior examination. No superimposed confluent pulmonary infiltrate.  No pneumothorax or pleural effusion. Cardiac size within normal limits. The pulmonary vascularity is normal. No acute bone abnormality.  PFT: No flowsheet data found.  EKG 05/30/21 Sinus rhythm, normal intervals. Minimal ST elevation, inferior leads.     Assessment & Plan:   Mild intermittent asthma in adult without complication - Plan: Pulmonary Function Test, mometasone-formoterol (DULERA) 200-5 MCG/ACT AERO, albuterol (VENTOLIN HFA) 108 (90 Base) MCG/ACT inhaler  Discussion: Juan Willis is a 51 year old male, never smoker with hyperlipidemia and asthma who returns to pulmonary clinic for evaluation of asthma.   He appears to have mild intermittent asthma with triggers that include spring allergies, cigarette smoke and strong scents.   He is to use dulera 200-795mcg 2 puffs twice daily scheduled. He can continue to use albuterol inhaler 1-2 puffs as needed every 4-6 hours on top of the dulera during sympomatic periods.   He is to follow up in 3 months with pulmonary function tests.  Melody ComasJonathan Cabria Micalizzi, MD [facility] Office: [phone]   Current Outpatient Medications:    albuterol (VENTOLIN HFA) 108 (90 Base) MCG/ACT inhaler, Inhale 2 puffs into the lungs every 6 (six) hours as needed for wheezing or shortness of breath., Disp: 8 g, Rfl: 2   mometasone-formoterol (DULERA) 200-5 MCG/ACT AERO, Inhale 2 puffs into the lungs 2 (two) times daily as needed for wheezing., Disp: 1 each, Rfl: 6

## 2022-01-23 NOTE — Patient Instructions (Addendum)
Use dulera inhaler 2 puffs twice daily with spacer - rinse mouth out after each use  Your primary care team will need to check your cholesterol levels  Use albuterol 1-2 puffs every 4-6 hours as needed  Recommend use allegra daily for your spring time allergies if needed

## 2022-02-13 ENCOUNTER — Encounter: Payer: Self-pay | Admitting: Nurse Practitioner

## 2022-02-13 ENCOUNTER — Ambulatory Visit: Payer: Medicaid Other | Attending: Nurse Practitioner | Admitting: Nurse Practitioner

## 2022-02-13 VITALS — BP 111/76 | HR 68 | Resp 18 | Ht 66.0 in | Wt 185.4 lb

## 2022-02-13 DIAGNOSIS — R718 Other abnormality of red blood cells: Secondary | ICD-10-CM

## 2022-02-13 DIAGNOSIS — Z125 Encounter for screening for malignant neoplasm of prostate: Secondary | ICD-10-CM

## 2022-02-13 DIAGNOSIS — Z114 Encounter for screening for human immunodeficiency virus [HIV]: Secondary | ICD-10-CM | POA: Diagnosis not present

## 2022-02-13 DIAGNOSIS — Z23 Encounter for immunization: Secondary | ICD-10-CM

## 2022-02-13 DIAGNOSIS — R7303 Prediabetes: Secondary | ICD-10-CM

## 2022-02-13 DIAGNOSIS — Z1211 Encounter for screening for malignant neoplasm of colon: Secondary | ICD-10-CM | POA: Diagnosis not present

## 2022-02-13 DIAGNOSIS — Z1159 Encounter for screening for other viral diseases: Secondary | ICD-10-CM | POA: Diagnosis not present

## 2022-02-13 DIAGNOSIS — R350 Frequency of micturition: Secondary | ICD-10-CM

## 2022-02-13 DIAGNOSIS — E785 Hyperlipidemia, unspecified: Secondary | ICD-10-CM | POA: Diagnosis not present

## 2022-02-13 LAB — POCT GLYCOSYLATED HEMOGLOBIN (HGB A1C): Hemoglobin A1C: 5.6 % (ref 4.0–5.6)

## 2022-02-13 LAB — GLUCOSE, POCT (MANUAL RESULT ENTRY): POC Glucose: 112 mg/dl — AB (ref 70–99)

## 2022-02-13 NOTE — Progress Notes (Signed)
Assessment & Plan:  [name] was seen today for prediabetes.  Diagnoses and all orders for this visit:  Prediabetes -     POCT glycosylated hemoglobin (Hb A1C) -     POCT glucose (manual entry) -     CMP14+EGFR  Colon cancer screening -     Ambulatory referral to Gastroenterology  Need for influenza vaccination -     Flu Vaccine QUAD 7moIM (Fluarix, Fluzone & Alfiuria Quad PF)  Dyslipidemia, goal LDL below 100 -     Lipid panel  Encounter for screening for HIV -     HIV antibody (with reflex)  Need for hepatitis C screening test -     HCV Ab w Reflex to Quant PCR  Prostate cancer screening -     PSA  Urine frequency -     PSA  RBC abnormality -     CBC    Patient has been counseled on age-appropriate routine health concerns for screening and prevention. These are reviewed and up-to-date. Referrals have been placed accordingly. Immunizations are up-to-date or declined.    Subjective:   Chief Complaint  Patient presents with   Prediabetes   HPI Juan Willis 51y.o. male presents to office today for follow up to prediabetes He has a past medical history of Asthma and High cholesterol.    Blood pressure is well controlled today. A1c is no longer in prediabetes range. He is currently not taking any statins for dyslipidemia. Will obtain fasting lipids today.  BP Readings from Last 3 Encounters:  02/13/22 111/76  01/23/22 118/72  08/14/21 116/72    The 10-year ASCVD risk score (Arnett DK, et al., 2019) is: 5.1%   Values used to calculate the score:     Age: [age]years     Sex: Male     Is Non-Hispanic African American: No     Diabetic: No     Tobacco smoker: No     Systolic Blood Pressure: 1594mmHg     Is BP treated: No     HDL Cholesterol: 30 mg/dL     Total Cholesterol: 203 mg/dL  Review of Systems  Constitutional:  Negative for fever, malaise/fatigue and weight loss.  HENT: Negative.  Negative for nosebleeds.   Eyes: Negative.  Negative for blurred  vision, double vision and photophobia.  Respiratory: Negative.  Negative for cough and shortness of breath.   Cardiovascular: Negative.  Negative for chest pain, palpitations and leg swelling.  Gastrointestinal: Negative.  Negative for heartburn, nausea and vomiting.  Musculoskeletal: Negative.  Negative for myalgias.  Neurological: Negative.  Negative for dizziness, focal weakness, seizures and headaches.  Psychiatric/Behavioral: Negative.  Negative for suicidal ideas.    Past Medical History:  Diagnosis Date   Asthma    High cholesterol     Past Surgical History:  Procedure Laterality Date   EYE SURGERY      Family History  Problem Relation Age of Onset   Hypertension Neg Hx    Diabetes Neg Hx     Social History Reviewed with no changes to be made today.   Outpatient Medications Prior to Visit  Medication Sig Dispense Refill   albuterol (VENTOLIN HFA) 108 (90 Base) MCG/ACT inhaler Inhale 2 puffs into the lungs every 6 (six) hours as needed for wheezing or shortness of breath. 8 g 2   mometasone-formoterol (DULERA) 200-5 MCG/ACT AERO Inhale 2 puffs into the lungs 2 (two) times daily as needed for wheezing. (Patient not taking: Reported on  02/13/2022) 1 each 6   No facility-administered medications prior to visit.    No Known Allergies     Objective:    BP 111/76    Pulse 68    Resp 18    Ht '5\' 6"'  (1.676 m)    Wt 185 lb 6 oz (84.1 kg)    SpO2 95%    BMI 29.92 kg/m  Wt Readings from Last 3 Encounters:  02/13/22 185 lb 6 oz (84.1 kg)  01/23/22 184 lb 6.4 oz (83.6 kg)  08/14/21 184 lb (83.5 kg)    Physical Exam Vitals and nursing note reviewed.  Constitutional:      Appearance: He is well-developed.  HENT:     Head: Normocephalic and atraumatic.  Cardiovascular:     Rate and Rhythm: Normal rate and regular rhythm.     Heart sounds: Normal heart sounds. No murmur heard.   No friction rub. No gallop.  Pulmonary:     Effort: Pulmonary effort is normal. No tachypnea  or respiratory distress.     Breath sounds: Normal breath sounds. No decreased breath sounds, wheezing, rhonchi or rales.  Chest:     Chest wall: No tenderness.  Abdominal:     General: Bowel sounds are normal.     Palpations: Abdomen is soft.  Musculoskeletal:        General: Normal range of motion.     Cervical back: Normal range of motion.  Skin:    General: Skin is warm and dry.  Neurological:     Mental Status: He is alert and oriented to person, place, and time.     Coordination: Coordination normal.  Psychiatric:        Behavior: Behavior normal. Behavior is cooperative.        Thought Content: Thought content normal.        Judgment: Judgment normal.         Patient has been counseled extensively about nutrition and exercise as well as the importance of adherence with medications and regular follow-up. The patient was given clear instructions to go to ER or return to medical center if symptoms don't improve, worsen or new problems develop. The patient verbalized understanding.   Follow-up: Return if symptoms worsen or fail to improve.   Juan Pounds, FNP-BC [facility] [place], [place]   02/13/2022, 10:41 AM

## 2022-02-14 LAB — CMP14+EGFR
ALT: 27 IU/L (ref 0–44)
AST: 20 IU/L (ref 0–40)
Albumin/Globulin Ratio: 1.7 (ref 1.2–2.2)
Albumin: 4.7 g/dL (ref 3.8–4.9)
Alkaline Phosphatase: 123 IU/L — ABNORMAL HIGH (ref 44–121)
BUN/Creatinine Ratio: 21 — ABNORMAL HIGH (ref 9–20)
BUN: 18 mg/dL (ref 6–24)
Bilirubin Total: 0.8 mg/dL (ref 0.0–1.2)
CO2: 23 mmol/L (ref 20–29)
Calcium: 9.5 mg/dL (ref 8.7–10.2)
Chloride: 102 mmol/L (ref 96–106)
Creatinine, Ser: 0.84 mg/dL (ref 0.76–1.27)
Globulin, Total: 2.8 g/dL (ref 1.5–4.5)
Glucose: 100 mg/dL — ABNORMAL HIGH (ref 70–99)
Potassium: 4.4 mmol/L (ref 3.5–5.2)
Sodium: 139 mmol/L (ref 134–144)
Total Protein: 7.5 g/dL (ref 6.0–8.5)
eGFR: 106 mL/min/{1.73_m2} (ref >59)

## 2022-02-14 LAB — CBC
Hematocrit: 48.3 % (ref 37.5–51.0)
Hemoglobin: 16.8 g/dL (ref 13.0–17.7)
MCH: 29.8 pg (ref 26.6–33.0)
MCHC: 34.8 g/dL (ref 31.5–35.7)
MCV: 86 fL (ref 79–97)
Platelets: 216 10*3/uL (ref 150–450)
RBC: 5.63 x10E6/uL (ref 4.14–5.80)
RDW: 13.2 % (ref 11.6–15.4)
WBC: 6.9 10*3/uL (ref 3.4–10.8)

## 2022-02-14 LAB — LIPID PANEL
Chol/HDL Ratio: 6.8 ratio — ABNORMAL HIGH (ref 0.0–5.0)
Cholesterol, Total: 224 mg/dL — ABNORMAL HIGH (ref 100–199)
HDL: 33 mg/dL — ABNORMAL LOW (ref >39)
LDL Chol Calc (NIH): 147 mg/dL — ABNORMAL HIGH (ref 0–99)
Triglycerides: 239 mg/dL — ABNORMAL HIGH (ref 0–149)
VLDL Cholesterol Cal: 44 mg/dL — ABNORMAL HIGH (ref 5–40)

## 2022-02-14 LAB — HIV ANTIBODY (ROUTINE TESTING W REFLEX): HIV Screen 4th Generation wRfx: NONREACTIVE

## 2022-02-14 LAB — HCV INTERPRETATION

## 2022-02-14 LAB — HCV AB W REFLEX TO QUANT PCR: HCV Ab: NONREACTIVE

## 2022-02-14 LAB — PSA: Prostate Specific Ag, Serum: 1.4 ng/mL (ref 0.0–4.0)

## 2022-02-18 ENCOUNTER — Other Ambulatory Visit: Payer: Self-pay | Admitting: Nurse Practitioner

## 2022-02-18 MED ORDER — ROSUVASTATIN CALCIUM 10 MG PO TABS: 10.0000 mg | ORAL_TABLET | Freq: Every day | ORAL | 3 refills | Status: DC

## 2022-02-19 ENCOUNTER — Telehealth: Payer: Self-pay

## 2022-02-19 NOTE — Telephone Encounter (Signed)
Called patient reviewed all information and repeated back to me. Will call if any questions.  ? ?

## 2022-03-08 ENCOUNTER — Other Ambulatory Visit: Payer: Self-pay

## 2022-03-08 ENCOUNTER — Ambulatory Visit (AMBULATORY_SURGERY_CENTER): Payer: Self-pay

## 2022-03-08 VITALS — Ht 66.0 in | Wt 185.0 lb

## 2022-03-08 DIAGNOSIS — Z1211 Encounter for screening for malignant neoplasm of colon: Secondary | ICD-10-CM

## 2022-03-08 MED ORDER — NA SULFATE-K SULFATE-MG SULF 17.5-3.13-1.6 GM/177ML PO SOLN: 1.0000 | Freq: Once | ORAL | 0 refills | Status: AC

## 2022-03-08 NOTE — Progress Notes (Signed)
Denies allergies to eggs or soy products. Denies complication of anesthesia or sedation. Denies use of weight loss medication. Denies use of O2.   Emmi instructions given for colonoscopy.  

## 2022-03-20 ENCOUNTER — Telehealth: Payer: Self-pay | Admitting: Gastroenterology

## 2022-03-20 NOTE — Telephone Encounter (Signed)
Patient called to reschedule colonoscopy 03/22/22 to 04/19/22 due to being sick.  ?

## 2022-03-22 ENCOUNTER — Encounter: Payer: Medicaid Other | Admitting: Gastroenterology

## 2022-04-16 ENCOUNTER — Telehealth: Payer: Self-pay | Admitting: Gastroenterology

## 2022-04-16 NOTE — Telephone Encounter (Signed)
Pt called back and RS to 5-30 at 11 am- new 5-30 instructions to my chart  ?

## 2022-04-16 NOTE — Telephone Encounter (Signed)
Suprep instructions for colon 5-5 sent via my chart  ?

## 2022-04-16 NOTE — Telephone Encounter (Signed)
Patient is in need of new instructions for his rescheduled procedure for 5/5 at 4:00. Please advise.  ?

## 2022-04-19 ENCOUNTER — Encounter: Payer: Medicaid Other | Admitting: Gastroenterology

## 2022-04-23 ENCOUNTER — Other Ambulatory Visit: Payer: Self-pay | Admitting: Pulmonary Disease

## 2022-04-23 DIAGNOSIS — J4520 Mild intermittent asthma, uncomplicated: Secondary | ICD-10-CM

## 2022-04-26 ENCOUNTER — Encounter: Payer: Medicaid Other | Admitting: Gastroenterology

## 2022-05-17 ENCOUNTER — Telehealth: Payer: Self-pay | Admitting: Gastroenterology

## 2022-05-17 NOTE — Telephone Encounter (Signed)
Patient called to reschedule colonoscopy 05/21/22 to 07/10/22. Per patient, needs more time to prepare.

## 2022-05-17 NOTE — Telephone Encounter (Signed)
Thank you for the update!

## 2022-05-21 ENCOUNTER — Encounter: Payer: Medicaid Other | Admitting: Gastroenterology

## 2022-07-08 ENCOUNTER — Telehealth: Payer: Self-pay | Admitting: Gastroenterology

## 2022-07-08 NOTE — Telephone Encounter (Signed)
Patient called to cancel his procedure scheduled for 07/10/22. He said his wife just got really sick and he needs to care for his children until she gets better. The patient was informed of a cancellation fee of $100.00 he said "Okay, I will call back to schedule as soon as I can" .

## 2022-07-10 ENCOUNTER — Encounter: Payer: Medicaid Other | Admitting: Gastroenterology

## 2023-06-04 ENCOUNTER — Ambulatory Visit: Payer: Medicaid Other | Admitting: Physician Assistant

## 2023-06-04 ENCOUNTER — Encounter: Payer: Self-pay | Admitting: Physician Assistant

## 2023-06-04 VITALS — BP 112/75 | HR 74 | Resp 16 | Ht 67.0 in | Wt 188.0 lb

## 2023-06-04 DIAGNOSIS — R300 Dysuria: Secondary | ICD-10-CM | POA: Diagnosis not present

## 2023-06-04 DIAGNOSIS — Z23 Encounter for immunization: Secondary | ICD-10-CM

## 2023-06-04 DIAGNOSIS — Z1211 Encounter for screening for malignant neoplasm of colon: Secondary | ICD-10-CM

## 2023-06-04 DIAGNOSIS — Z125 Encounter for screening for malignant neoplasm of prostate: Secondary | ICD-10-CM

## 2023-06-04 DIAGNOSIS — R7303 Prediabetes: Secondary | ICD-10-CM | POA: Insufficient documentation

## 2023-06-04 DIAGNOSIS — E782 Mixed hyperlipidemia: Secondary | ICD-10-CM | POA: Diagnosis not present

## 2023-06-04 LAB — POCT URINALYSIS DIP (CLINITEK)
Bilirubin, UA: NEGATIVE
Glucose, UA: NEGATIVE mg/dL
Ketones, POC UA: NEGATIVE mg/dL
Leukocytes, UA: NEGATIVE
Nitrite, UA: NEGATIVE
POC PROTEIN,UA: NEGATIVE
Spec Grav, UA: >=1.03 — AB (ref 1.010–1.025)
Urobilinogen, UA: 0.2 E.U./dL
pH, UA: 5.5 (ref 5.0–8.0)

## 2023-06-04 LAB — POCT GLYCOSYLATED HEMOGLOBIN (HGB A1C): HbA1c, POC (prediabetic range): 5.7 % (ref 5.7–6.4)

## 2023-06-04 NOTE — Patient Instructions (Addendum)
Your A1c is 5.7.  This is considered prediabetes.  I encourage you to follow a low sugar diet.  I encourage you to make sure that you are drinking at least 64 ounces of water a day, more when it is hot outside.  We will call you with your lab results when they are available.  I encourage you to consider having your shingles vaccine, please feel free to return to the mobile unit as needed.  Roney Jaffe, PA-C Physician Assistant [facility] [email or web address]   Recombinant Zoster (Shingles) Vaccine: What You Need to Know Many vaccine information statements are available in Spanish and other languages. See PromoAge.com.br. 1. Why get vaccinated? Recombinant zoster (shingles) vaccine can prevent shingles. Shingles (also called herpes zoster, or just zoster) is a painful skin rash, usually with blisters. In addition to the rash, shingles can cause fever, headache, chills, or upset stomach. Rarely, shingles can lead to complications such as pneumonia, hearing problems, blindness, brain inflammation (encephalitis), or death. The risk of shingles increases with age. The most common complication of shingles is long-term nerve pain called postherpetic neuralgia (PHN). PHN occurs in the areas where the shingles rash was and can last for months or years after the rash goes away. The pain from PHN can be severe and debilitating. The risk of PHN increases with age. An older adult with shingles is more likely to develop PHN and have longer lasting and more severe pain than a younger person. People with weakened immune systems also have a higher risk of getting shingles and complications from the disease. Shingles is caused by varicella-zoster virus, the same virus that causes chickenpox. After you have chickenpox, the virus stays in your body and can cause shingles later in life. Shingles cannot be passed from one person to another, but the virus  that causes shingles can spread and cause chickenpox in someone who has never had chickenpox or has never received chickenpox vaccine. 2. Recombinant shingles vaccine Recombinant shingles vaccine provides strong protection against shingles. By preventing shingles, recombinant shingles vaccine also protects against PHN and other complications. Recombinant shingles vaccine is recommended for: Adults 50 years and older Adults 19 years and older who have a weakened immune system because of disease or treatments Shingles vaccine is given as a two-dose series. For most people, the second dose should be given 2 to 6 months after the first dose. Some people who have or will have a weakened immune system can get the second dose 1 to 2 months after the first dose. Ask your health care provider for guidance. People who have had shingles in the past and people who have received varicella (chickenpox) vaccine are recommended to get recombinant shingles vaccine. The vaccine is also recommended for people who have already gotten another type of shingles vaccine, the live shingles vaccine. There is no live virus in recombinant shingles vaccine. Shingles vaccine may be given at the same time as other vaccines. 3. Talk with your health care provider Tell your vaccination provider if the person getting the vaccine: Has had an allergic reaction after a previous dose of recombinant shingles vaccine, or has any severe, life-threatening allergies Is currently experiencing an episode of shingles Is pregnant In some cases, your health care provider may decide to postpone shingles vaccination until a future visit. People with minor illnesses, such as a cold, may be vaccinated. People who are moderately or severely ill should usually wait until they recover before getting recombinant shingles vaccine. Your health  care provider can give you more information. 4. Risks of a vaccine reaction A sore arm with mild or moderate pain  is very common after recombinant shingles vaccine. Redness and swelling can also happen at the site of the injection. Tiredness, muscle pain, headache, shivering, fever, stomach pain, and nausea are common after recombinant shingles vaccine. These side effects may temporarily prevent a vaccinated person from doing regular activities. Symptoms usually go away on their own in 2 to 3 days. You should still get the second dose of recombinant shingles vaccine even if you had one of these reactions after the first dose. Guillain-Barr syndrome (GBS), a serious nervous system disorder, has been reported very rarely after recombinant zoster vaccine. People sometimes faint after medical procedures, including vaccination. Tell your provider if you feel dizzy or have vision changes or ringing in the ears. As with any medicine, there is a very remote chance of a vaccine causing a severe allergic reaction, other serious injury, or death. 5. What if there is a serious problem? An allergic reaction could occur after the vaccinated person leaves the clinic. If you see signs of a severe allergic reaction (hives, swelling of the face and throat, difficulty breathing, a fast heartbeat, dizziness, or weakness), call 9-1-1 and get the person to the nearest hospital. For other signs that concern you, call your health care provider. Adverse reactions should be reported to the Vaccine Adverse Event Reporting System (VAERS). Your health care provider will usually file this report, or you can do it yourself. Visit the VAERS website at www.vaers.LAgents.no or call (781) 343-6778. VAERS is only for reporting reactions, and VAERS staff members do not give medical advice. 6. How can I learn more? Ask your health care provider. Call your local or state health department. Visit the website of the Food and Drug Administration (FDA) for vaccine package inserts and additional information at  GoldCloset.com.ee. Contact the Centers for Disease Control and Prevention (CDC): Call 587-116-9051 (1-800-CDC-INFO) or Visit CDC's website at PicCapture.uy. Source: CDC Vaccine Information Statement Recombinant Zoster Vaccine (01/26/2021) This same material is available at FootballExhibition.com.br for no charge. This information is not intended to replace advice given to you by your health care provider. Make sure you discuss any questions you have with your health care provider. Document Revised: 12/25/2022 Document Reviewed: 12/25/2022 Elsevier Patient Education  2024 [employer].

## 2023-06-04 NOTE — Progress Notes (Signed)
Would like a physical today. Outside of drinking water he has dysuria

## 2023-06-04 NOTE — Progress Notes (Signed)
Established Patient Office Visit  Subjective   Patient ID: Juan Willis, male    DOB: 06-01-1971  Age: 52 y.o. MRN: [id number]  Chief Complaint  Patient presents with   Annual Exam    States that he has not seen his primary care provider since February 2023.  States that he has been experiencing stinging after urinating, but states that this only occurs if he has been drinking soda.  States this does not happen if he has been drinking water.  States this has been ongoing for the past couple of months.  States that he has not had any prepubic discomfort, lower back pain, denies any erythema or lesions.  States that he has not had any urinary frequency.  States that he does drink 4-6 bottles of water on some days, but does endorse that he only does this at is very hot outside, states other days he does not drink any water at all.  States that he only took the cholesterol medication for approximately 2 weeks after starting it after being seen by his provider in February 2023.  States that he does did not want to continue.  States that he previously had the Cologuard test sent to him but does not know where he placed it and would like to restart that process.  No other concerns at this time       Past Medical History:  Diagnosis Date   Allergy    Asthma    High cholesterol    Prediabetes    Social History   Socioeconomic History   Marital status: Married    Spouse name: Not on file   Number of children: Not on file   Years of education: Not on file   Highest education level: Not on file  Occupational History   Not on file  Tobacco Use   Smoking status: Never   Smokeless tobacco: Never  Vaping Use   Vaping Use: Never used  Substance and Sexual Activity   Alcohol use: Yes    Comment: occ   Drug use: No   Sexual activity: Yes  Other Topics Concern   Not on file  Social History Narrative   Not on file   Social Determinants of Health   Financial Resource Strain: Not  on file  Food Insecurity: Not on file  Transportation Needs: Not on file  Physical Activity: Not on file  Stress: Not on file  Social Connections: Not on file  Intimate Partner Violence: Not on file   Family History  Problem Relation Age of Onset   Hypertension Neg Hx    Diabetes Neg Hx    Colon cancer Neg Hx    Esophageal cancer Neg Hx    Rectal cancer Neg Hx    Stomach cancer Neg Hx    No Known Allergies  Review of Systems  Constitutional:  Negative for chills and fever.  HENT: Negative.    Eyes: Negative.   Respiratory:  Negative for shortness of breath.   Cardiovascular:  Negative for chest pain.  Gastrointestinal:  Negative for abdominal pain, nausea and vomiting.  Genitourinary:  Positive for dysuria. Negative for flank pain, frequency and hematuria.  Musculoskeletal:  Negative for back pain.  Skin: Negative.   Neurological: Negative.   Endo/Heme/Allergies: Negative.   Psychiatric/Behavioral: Negative.        Objective:     Ht 5\' 7"  (1.702 m)   Wt 188 lb (85.3 kg)   BMI 29.44 kg/m  BP Readings from  Last 3 Encounters:  06/04/23 112/75  02/13/22 111/76  01/23/22 118/72   Wt Readings from Last 3 Encounters:  06/04/23 188 lb (85.3 kg)  03/08/22 185 lb (83.9 kg)  02/13/22 185 lb 6 oz (84.1 kg)      Physical Exam Vitals and nursing note reviewed.  Constitutional:      Appearance: Normal appearance.  HENT:     Head: Normocephalic and atraumatic.     Right Ear: External ear normal.     Left Ear: External ear normal.     Nose: Nose normal.     Mouth/Throat:     Mouth: Mucous membranes are moist.     Pharynx: Oropharynx is clear.  Eyes:     Extraocular Movements: Extraocular movements intact.     Conjunctiva/sclera: Conjunctivae normal.     Pupils: Pupils are equal, round, and reactive to light.  Cardiovascular:     Rate and Rhythm: Normal rate and regular rhythm.     Pulses: Normal pulses.     Heart sounds: Normal heart sounds.  Pulmonary:      Effort: Pulmonary effort is normal.     Breath sounds: Normal breath sounds.  Abdominal:     Tenderness: There is no right CVA tenderness or left CVA tenderness.  Musculoskeletal:        General: Normal range of motion.     Cervical back: Normal range of motion and neck supple.  Skin:    General: Skin is warm and dry.  Neurological:     General: No focal deficit present.     Mental Status: He is alert.  Psychiatric:        Mood and Affect: Mood normal.        Behavior: Behavior normal.        Thought Content: Thought content normal.        Judgment: Judgment normal.        Assessment & Plan:   Problem List Items Addressed This Visit   None 1. Prediabetes A1c 5.7.  Patient education given on low sugar diet.  [facility]  Patient encouraged to continue follow-up with primary care provider as needed, encouraged to return to mobile unit as needed.  Red flags given for prompt reevaluation - CBC with Differential/Platelet; Future - Comp. Metabolic Panel (12); Future - HgB A1c - POCT URINALYSIS DIP (CLINITEK)  2. Mixed hyperlipidemia Patient educated on his current cardiovascular risk score, patient wants to wait until lipid panel results prior to considering restarting atorvastatin. - Lipid panel; Future  3. Dysuria UA negative for urinary tract infection, review with urine culture.  Patient encouraged to increase water intake - Urine Culture  4. Screen for colon cancer  - Cologuard  5. Screening PSA (prostate specific antigen)  - PSA; Future  6. Need for shingles vaccine Patient declines shingles vaccine at this time, patient education given on vaccine information.   I have reviewed the patient's medical history (PMH, PSH, Social History, Family History, Medications, and allergies) , and have been updated if relevant. I spent 30 minutes reviewing chart and  face to face  time with patient.     No follow-ups on file.    Kasandra Knudsen Mayers, PA-C

## 2023-06-05 ENCOUNTER — Ambulatory Visit: Payer: Medicaid Other | Attending: Nurse Practitioner

## 2023-06-05 DIAGNOSIS — Z125 Encounter for screening for malignant neoplasm of prostate: Secondary | ICD-10-CM

## 2023-06-05 DIAGNOSIS — E782 Mixed hyperlipidemia: Secondary | ICD-10-CM | POA: Diagnosis not present

## 2023-06-05 DIAGNOSIS — R7303 Prediabetes: Secondary | ICD-10-CM | POA: Diagnosis not present

## 2023-06-05 DIAGNOSIS — R300 Dysuria: Secondary | ICD-10-CM | POA: Insufficient documentation

## 2023-06-06 ENCOUNTER — Other Ambulatory Visit: Payer: Self-pay | Admitting: Critical Care Medicine

## 2023-06-06 ENCOUNTER — Telehealth: Payer: Self-pay

## 2023-06-06 LAB — CBC WITH DIFFERENTIAL/PLATELET
Basophils Absolute: 0.1 10*3/uL (ref 0.0–0.2)
Basos: 1 %
EOS (ABSOLUTE): 0.2 10*3/uL (ref 0.0–0.4)
Eos: 3 %
Hematocrit: 51 % (ref 37.5–51.0)
Hemoglobin: 17.3 g/dL (ref 13.0–17.7)
Immature Grans (Abs): 0 10*3/uL (ref 0.0–0.1)
Immature Granulocytes: 0 %
Lymphocytes Absolute: 3.6 10*3/uL — ABNORMAL HIGH (ref 0.7–3.1)
Lymphs: 45 %
MCH: 29.8 pg (ref 26.6–33.0)
MCHC: 33.9 g/dL (ref 31.5–35.7)
MCV: 88 fL (ref 79–97)
Monocytes Absolute: 0.6 10*3/uL (ref 0.1–0.9)
Monocytes: 8 %
Neutrophils Absolute: 3.3 10*3/uL (ref 1.4–7.0)
Neutrophils: 43 %
Platelets: 212 10*3/uL (ref 150–450)
RBC: 5.81 x10E6/uL — ABNORMAL HIGH (ref 4.14–5.80)
RDW: 13.8 % (ref 11.6–15.4)
WBC: 7.8 10*3/uL (ref 3.4–10.8)

## 2023-06-06 LAB — PSA: Prostate Specific Ag, Serum: 1.4 ng/mL (ref 0.0–4.0)

## 2023-06-06 LAB — URINE CULTURE

## 2023-06-06 LAB — COMP. METABOLIC PANEL (12)
AST: 20 IU/L (ref 0–40)
Albumin/Globulin Ratio: 1.4
Albumin: 4.3 g/dL (ref 3.8–4.9)
Alkaline Phosphatase: 105 IU/L (ref 44–121)
BUN/Creatinine Ratio: 22 — ABNORMAL HIGH (ref 9–20)
BUN: 19 mg/dL (ref 6–24)
Bilirubin Total: 0.6 mg/dL (ref 0.0–1.2)
Calcium: 9.4 mg/dL (ref 8.7–10.2)
Chloride: 102 mmol/L (ref 96–106)
Creatinine, Ser: 0.85 mg/dL (ref 0.76–1.27)
Globulin, Total: 3.1 g/dL (ref 1.5–4.5)
Glucose: 99 mg/dL (ref 70–99)
Potassium: 4.4 mmol/L (ref 3.5–5.2)
Sodium: 138 mmol/L (ref 134–144)
Total Protein: 7.4 g/dL (ref 6.0–8.5)
eGFR: 105 mL/min/{1.73_m2} (ref >59)

## 2023-06-06 LAB — LIPID PANEL
Chol/HDL Ratio: 6.9 ratio — ABNORMAL HIGH (ref 0.0–5.0)
Cholesterol, Total: 201 mg/dL — ABNORMAL HIGH (ref 100–199)
HDL: 29 mg/dL — ABNORMAL LOW (ref >39)
LDL Chol Calc (NIH): 101 mg/dL — ABNORMAL HIGH (ref 0–99)
Triglycerides: 419 mg/dL — ABNORMAL HIGH (ref 0–149)
VLDL Cholesterol Cal: 71 mg/dL — ABNORMAL HIGH (ref 5–40)

## 2023-06-06 MED ORDER — ROSUVASTATIN CALCIUM 10 MG PO TABS: 10.0000 mg | ORAL_TABLET | Freq: Every day | ORAL | 3 refills | Status: DC

## 2023-06-06 NOTE — Telephone Encounter (Signed)
Pt was called and is aware of results, DOB was confirmed.  ?

## 2023-06-06 NOTE — Progress Notes (Signed)
I am covering Lyla Son, let this patient know his cholesterol remains extremely high and I have sent a prescription for a cholesterol medication to his pharmacy to take daily.  He also needs to follow a low-fat diet because his triglyceride levels are high as well.  He should follow-up with his primary care doctor.  Also let him know his liver and kidneys are normal blood counts are normal and his PSA is normal no evidence of prostate cancer

## 2023-06-06 NOTE — Telephone Encounter (Signed)
-----   Message from Storm Frisk, MD sent at 06/06/2023  6:26 AM EDT ----- I am covering Juan Willis, let this patient know his cholesterol remains extremely high and I have sent a prescription for a cholesterol medication to his pharmacy to take daily.  He also needs to follow a low-fat diet because his triglyceride levels are high as well.  He should follow-up with his primary care doctor.  Also let him know his liver and kidneys are normal blood counts are normal and his PSA is normal no evidence of prostate cancer

## 2023-06-07 NOTE — Progress Notes (Signed)
Let pt know urine culture negative no infection

## 2023-06-10 ENCOUNTER — Telehealth: Payer: Self-pay

## 2023-06-10 NOTE — Telephone Encounter (Signed)
-----   Message from Storm Frisk, MD sent at 06/07/2023  6:09 AM EDT ----- Let pt know urine culture negative no infection

## 2023-06-10 NOTE — Telephone Encounter (Signed)
Pt was called and is aware of results, DOB was confirmed.  ?

## 2023-08-13 ENCOUNTER — Ambulatory Visit: Payer: Medicaid Other | Admitting: Nurse Practitioner

## 2024-02-11 ENCOUNTER — Encounter: Payer: Self-pay | Admitting: Pulmonary Disease

## 2024-03-04 ENCOUNTER — Other Ambulatory Visit: Payer: Self-pay

## 2024-03-04 ENCOUNTER — Emergency Department (HOSPITAL_BASED_OUTPATIENT_CLINIC_OR_DEPARTMENT_OTHER)
Admission: EM | Admit: 2024-03-04 | Discharge: 2024-03-04 | Disposition: A | Discharge status: Home / Self Care | Attending: Emergency Medicine | Admitting: Emergency Medicine

## 2024-03-04 ENCOUNTER — Encounter (HOSPITAL_BASED_OUTPATIENT_CLINIC_OR_DEPARTMENT_OTHER): Payer: Self-pay

## 2024-03-04 DIAGNOSIS — R519 Headache, unspecified: Secondary | ICD-10-CM

## 2024-03-04 DIAGNOSIS — R7303 Prediabetes: Secondary | ICD-10-CM | POA: Diagnosis not present

## 2024-03-04 DIAGNOSIS — J101 Influenza due to other identified influenza virus with other respiratory manifestations: Secondary | ICD-10-CM | POA: Diagnosis not present

## 2024-03-04 LAB — COMPREHENSIVE METABOLIC PANEL
ALT: 36 U/L (ref 0–44)
AST: 35 U/L (ref 15–41)
Albumin: 3.7 g/dL (ref 3.5–5.0)
Alkaline Phosphatase: 86 U/L (ref 38–126)
Anion gap: 10 (ref 5–15)
BUN: 20 mg/dL (ref 6–20)
CO2: 23 mmol/L (ref 22–32)
Calcium: 8.3 mg/dL — ABNORMAL LOW (ref 8.9–10.3)
Chloride: 99 mmol/L (ref 98–111)
Creatinine, Ser: 0.92 mg/dL (ref 0.61–1.24)
GFR, Estimated: >60 mL/min (ref >60)
Glucose, Bld: 164 mg/dL — ABNORMAL HIGH (ref 70–99)
Potassium: 3.6 mmol/L (ref 3.5–5.1)
Sodium: 132 mmol/L — ABNORMAL LOW (ref 135–145)
Total Bilirubin: 0.8 mg/dL (ref 0.0–1.2)
Total Protein: 7.7 g/dL (ref 6.5–8.1)

## 2024-03-04 LAB — CBC WITH DIFFERENTIAL/PLATELET
Abs Immature Granulocytes: 0.01 10*3/uL (ref 0.00–0.07)
Basophils Absolute: 0 10*3/uL (ref 0.0–0.1)
Basophils Relative: 1 %
Eosinophils Absolute: 0.1 10*3/uL (ref 0.0–0.5)
Eosinophils Relative: 1 %
HCT: 46.3 % (ref 39.0–52.0)
Hemoglobin: 16.4 g/dL (ref 13.0–17.0)
Immature Granulocytes: 0 %
Lymphocytes Relative: 27 %
Lymphs Abs: 1.5 10*3/uL (ref 0.7–4.0)
MCH: 30.3 pg (ref 26.0–34.0)
MCHC: 35.4 g/dL (ref 30.0–36.0)
MCV: 85.4 fL (ref 80.0–100.0)
Monocytes Absolute: 0.9 10*3/uL (ref 0.1–1.0)
Monocytes Relative: 16 %
Neutro Abs: 2.9 10*3/uL (ref 1.7–7.7)
Neutrophils Relative %: 55 %
Platelets: 188 10*3/uL (ref 150–400)
RBC: 5.42 MIL/uL (ref 4.22–5.81)
RDW: 13.2 % (ref 11.5–15.5)
WBC: 5.3 10*3/uL (ref 4.0–10.5)
nRBC: 0 % (ref 0.0–0.2)

## 2024-03-04 LAB — RESP PANEL BY RT-PCR (RSV, FLU A&B, COVID)  RVPGX2
Influenza A by PCR: POSITIVE — AB
Influenza B by PCR: NEGATIVE
Resp Syncytial Virus by PCR: NEGATIVE
SARS Coronavirus 2 by RT PCR: NEGATIVE

## 2024-03-04 MED ORDER — METOCLOPRAMIDE HCL 5 MG/ML IJ SOLN
10.0000 mg | Freq: Once | INTRAMUSCULAR | Status: AC
  Administered 2024-03-04: 10 mg via INTRAVENOUS
  Filled 2024-03-04: qty 2

## 2024-03-04 MED ORDER — KETOROLAC TROMETHAMINE 15 MG/ML IJ SOLN
15.0000 mg | Freq: Once | INTRAMUSCULAR | Status: AC
  Administered 2024-03-04: 15 mg via INTRAVENOUS
  Filled 2024-03-04: qty 1

## 2024-03-04 MED ORDER — ACETAMINOPHEN 500 MG PO TABS
1000.0000 mg | ORAL_TABLET | Freq: Once | ORAL | Status: AC
  Administered 2024-03-04: 1000 mg via ORAL
  Filled 2024-03-04: qty 2

## 2024-03-04 NOTE — ED Provider Notes (Signed)
 [facility] Provider Note   CSN: [id number] Arrival date & time: 03/04/24  4098     History  Chief Complaint  Patient presents with   Headache    Juan Willis is a 53 y.o. male.  Patient is a 53 year old male with past medical history of prediabetes presenting to the emergency department with fever and headaches.  The patient states that he has had a fever for the last 3 days.  He states that his fever broke last night but he is continuing to have the headache this morning.  He reports it is a frontal headache that has become more severe throughout the week.  He is reports associated nausea but denies any vomiting.  He states that he did have 2 days of cough before the fever started.  He states he has been taking Motrin at home with minimal relief, last took a dose last night.  He denies any vision changes, numbness or weakness.  He states that his wife was sick with similar symptoms about 2 weeks ago.  He denies any neck pain or stiffness.  The history is provided by the patient.  Headache      Home Medications Prior to Admission medications   Medication Sig Start Date End Date Taking? Authorizing Provider  mometasone-formoterol (DULERA) 200-5 MCG/ACT AERO Inhale 2 puffs into the lungs 2 (two) times daily as needed for wheezing. Patient not taking: Reported on 06/04/2023 01/23/22   Martina Sinner, MD  rosuvastatin (CRESTOR) 10 MG tablet Take 1 tablet (10 mg total) by mouth daily. 06/06/23   Storm Frisk, MD  VENTOLIN HFA 108 (90 Base) MCG/ACT inhaler INHALE 2 PUFFS INTO THE LUNGS EVERY 6 HOURS AS NEEDED FOR WHEEZING OR SHORTNESS OF BREATH 04/24/22   Martina Sinner, MD  atorvastatin (LIPITOR) 20 MG tablet Take 1 tablet (20 mg total) by mouth daily. 02/11/20 03/31/20  Claiborne Rigg, NP  omeprazole (PRILOSEC) 20 MG capsule Take 1 capsule (20 mg total) by mouth daily. Patient not taking: Reported on 02/09/2020 03/19/19 03/19/20   Petrucelli, Pleas Koch, PA-C      Allergies    Patient has no known allergies.    Review of Systems   Review of Systems  Neurological:  Positive for headaches.    Physical Exam Updated Vital Signs BP 120/82 (BP Location: Right Arm)   Pulse 89   Temp 98.7 F (37.1 C) (Oral)   Resp 16   Ht 5\' 6"  (1.676 m)   Wt 85.3 kg   SpO2 96%   BMI 30.34 kg/m  Physical Exam Vitals and nursing note reviewed.  Constitutional:      General: He is not in acute distress.    Appearance: He is well-developed.  HENT:     Head: Normocephalic and atraumatic.     Mouth/Throat:     Mouth: Mucous membranes are moist.     Pharynx: Oropharynx is clear.  Eyes:     Extraocular Movements: Extraocular movements intact.     Pupils: Pupils are equal, round, and reactive to light.  Neck:     Meningeal: Brudzinski's sign and Kernig's sign absent.  Cardiovascular:     Rate and Rhythm: Normal rate and regular rhythm.     Heart sounds: Normal heart sounds.  Pulmonary:     Effort: Pulmonary effort is normal.     Breath sounds: Normal breath sounds.  Abdominal:     General: Bowel sounds are normal.  Palpations: Abdomen is soft.  Musculoskeletal:        General: Normal range of motion.     Cervical back: Normal range of motion and neck supple. No rigidity.  Skin:    General: Skin is warm and dry.  Neurological:     Mental Status: He is alert and oriented to person, place, and time.     GCS: GCS eye subscore is 4. GCS verbal subscore is 5. GCS motor subscore is 6.     Cranial Nerves: No cranial nerve deficit, dysarthria or facial asymmetry.     Sensory: No sensory deficit.     Motor: No weakness.     Coordination: Coordination normal.  Psychiatric:        Mood and Affect: Mood normal.        Speech: Speech normal.        Behavior: Behavior normal.     ED Results / Procedures / Treatments   Labs (all labs ordered are listed, but only abnormal results are displayed) Labs Reviewed  RESP PANEL  BY RT-PCR (RSV, FLU A&B, COVID)  RVPGX2 - Abnormal; Notable for the following components:      Result Value   Influenza A by PCR POSITIVE (*)    All other components within normal limits  COMPREHENSIVE METABOLIC PANEL - Abnormal; Notable for the following components:   Sodium 132 (*)    Glucose, Bld 164 (*)    Calcium 8.3 (*)    All other components within normal limits  CBC WITH DIFFERENTIAL/PLATELET    EKG None  Radiology No results found.  Procedures Procedures    Medications Ordered in ED Medications  ketorolac (TORADOL) 15 MG/ML injection 15 mg (15 mg Intravenous Given 03/04/24 0827)  metoCLOPramide (REGLAN) injection 10 mg (10 mg Intravenous Given 03/04/24 0829)  acetaminophen (TYLENOL) tablet 1,000 mg (1,000 mg Oral Given 03/04/24 0820)    ED Course/ Medical Decision Making/ A&P Clinical Course as of 03/04/24 0939  Thu Mar 04, 2024  0918 Influenza is positive, otherwise labs within normal range. Suspect this is cause of his symptoms. [VK]  [id number] Upon reassessment, the patient's headache has significantly improved.  He stable for discharge home with outpatient follow-up. [VK]    Clinical Course User Index [VK] Rexford Maus, DO                                 Medical Decision Making This patient presents to the ED with chief complaint(s) of headache, fever with pertinent past medical history of pre-diabetes which further complicates the presenting complaint. The complaint involves an extensive differential diagnosis and also carries with it a high risk of complications and morbidity.    The differential diagnosis includes patient's headache was not sudden onset without focal deficits making ICH or mass effect unlikely, patient is afebrile here with no neck pain or stiffness and negative Kernig and Brudzinski sign making meningitis unlikely, considering viral syndrome, tension headache, migraine headache, dehydration  Additional history obtained: Additional  history obtained from N/A Records reviewed Primary Care Documents  ED Course and Reassessment: On patient's arrival he is afebrile, hemodynamically stable and in no acute distress without any focal neurologic deficits.  Patient will of labs including viral swab to evaluate for etiology of his fever and headache and will be given headache cocktail with Toradol, Tylenol and Reglan and will be closely reassessed.  Independent labs interpretation:  The following labs were  independently interpreted: Flu A positive, otherwise labs within normal range  Independent visualization of imaging: - N/A  Consultation: - Consulted or discussed management/test interpretation w/ external professional: N/A  Consideration for admission or further workup: Patient has no emergent conditions requiring admission or further work-up at this time and is stable for discharge home with primary care follow-up  Social Determinants of health: N/A    Amount and/or Complexity of Data Reviewed Labs: ordered.  Risk OTC drugs. Prescription drug management.          Final Clinical Impression(s) / ED Diagnoses Final diagnoses:  Influenza A  Bad headache    Rx / DC Orders ED Discharge Orders     None         Rexford Maus, DO 03/04/24 4098

## 2024-03-04 NOTE — ED Triage Notes (Signed)
 Pt states that over the past 3 days he has had a high fever and headache. Says that his temperature was over 100. This morning the fever has come down with ibuprofen.   Robina Ade, RN

## 2024-03-04 NOTE — Discharge Instructions (Signed)
 You were seen in the emergency department for your fevers and headache.  You tested positive for the flu.  Your labs are otherwise normal and your headache improved with medication in the ER.  You can continue to take Tylenol and Motrin every 6 hours as needed for your headaches at home.  You can follow-up with your primary doctor in the next few days to have your symptoms rechecked.  You should return to the emergency department if you are having significantly worsening headache, severe neck pain or stiffness, repetitive vomiting, severe shortness of breath or any other new or concerning symptoms.

## 2024-05-20 ENCOUNTER — Ambulatory Visit (HOSPITAL_COMMUNITY): Payer: Self-pay

## 2024-05-20 ENCOUNTER — Encounter (HOSPITAL_COMMUNITY): Payer: Self-pay

## 2024-05-20 ENCOUNTER — Ambulatory Visit (HOSPITAL_COMMUNITY)
Admission: EM | Admit: 2024-05-20 | Discharge: 2024-05-20 | Disposition: A | Discharge status: Home / Self Care | Attending: Family Medicine | Admitting: Family Medicine

## 2024-05-20 DIAGNOSIS — R202 Paresthesia of skin: Secondary | ICD-10-CM | POA: Diagnosis not present

## 2024-05-20 DIAGNOSIS — I739 Peripheral vascular disease, unspecified: Secondary | ICD-10-CM | POA: Diagnosis not present

## 2024-05-20 DIAGNOSIS — M79662 Pain in left lower leg: Secondary | ICD-10-CM | POA: Diagnosis not present

## 2024-05-20 LAB — POCT FASTING CBG KUC MANUAL ENTRY: POCT Glucose (KUC): 190 mg/dL — AB (ref 70–99)

## 2024-05-20 LAB — BASIC METABOLIC PANEL WITH GFR
Anion gap: 12 (ref 5–15)
BUN: 16 mg/dL (ref 6–20)
CO2: 23 mmol/L (ref 22–32)
Calcium: 9.4 mg/dL (ref 8.9–10.3)
Chloride: 101 mmol/L (ref 98–111)
Creatinine, Ser: 0.84 mg/dL (ref 0.61–1.24)
GFR, Estimated: >60 mL/min (ref >60)
Glucose, Bld: 137 mg/dL — ABNORMAL HIGH (ref 70–99)
Potassium: 4 mmol/L (ref 3.5–5.1)
Sodium: 136 mmol/L (ref 135–145)

## 2024-05-20 LAB — TSH: TSH: 1.397 u[IU]/mL (ref 0.350–4.500)

## 2024-05-20 LAB — VITAMIN B12: Vitamin B-12: 467 pg/mL (ref 180–914)

## 2024-05-20 MED ORDER — GABAPENTIN 100 MG PO CAPS: 100.0000 mg | ORAL_CAPSULE | Freq: Every day | ORAL | 0 refills | Status: AC

## 2024-05-20 NOTE — Discharge Instructions (Signed)
 Your sugar was 190.  Since this was within an hour of you eating some chips, it most likely means that you still only have prediabetes and not diabetes.  Still, your primary care can do more test to sort that out. We have drawn blood to check electrolytes and kidney function and thyroid  function and B12.  Our staff will notify you if anything is significantly abnormal.  Begin taking gabapentin 100 mg--take 1 capsule nightly for a few nights.  If it is not helping your symptoms and helping you rest, then go up to 2 capsules every night.  Again in a few nights if still not helping much at all, increase to 3 capsules nightly.  This medicine can potentially cause hangover in the morning and dizziness and sleepiness.   Please make an appointment with your primary care to follow-up this issue and further evaluate.  If you have Medicaid for your medical coverage, you most likely will need an authorization from them to go see a specialist, such as a neurologist.

## 2024-05-20 NOTE — ED Provider Notes (Signed)
 [facility]    CSN: [id number] Arrival date & time: 05/20/24  1100      History   Chief Complaint Chief Complaint  Patient presents with   Foot Pain    HPI Juan Willis is a 53 y.o. male.    Foot Pain  Here for burning pain from his left knee down to his foot.  This occurs after he is walked a bit and then will go away when he rests.  He will have some discomfort in the left leg at night when he is trying to rest also.  It is not actually hurting while he is here in the clinic sitting.  No cramping in the leg. No bowel or bladder incontinence  He does not have any symptoms in his right leg.  No trauma.  No low back pain.  He does have a history of prediabetes, and his last A1c in 2024 was 5.7.  That was done at the mobile clinic, and he has not seen his primary care since 2023.  Past Medical History:  Diagnosis Date   Allergy    Asthma    High cholesterol    Prediabetes     Patient Active Problem List   Diagnosis Date Noted   Dysuria 06/05/2023   Prediabetes 06/04/2023   Mixed hyperlipidemia 06/04/2023   Neck pain 04/14/2017    Past Surgical History:  Procedure Laterality Date   EYE SURGERY         Home Medications    Prior to Admission medications   Medication Sig Start Date End Date Taking? Authorizing Provider  gabapentin (NEURONTIN) 100 MG capsule Take 1-3 capsules (100-300 mg total) by mouth at bedtime. 05/20/24  Yes Aara Jacquot, Paige Boatman, MD  atorvastatin  (LIPITOR) 20 MG tablet Take 1 tablet (20 mg total) by mouth daily. 02/11/20 03/31/20  Fleming, Zelda W, NP  omeprazole  (PRILOSEC) 20 MG capsule Take 1 capsule (20 mg total) by mouth daily. Patient not taking: Reported on 02/09/2020 03/19/19 03/19/20  Petrucelli, Arlon Bergamo, PA-C    Family History Family History  Problem Relation Age of Onset   Hypertension Neg Hx    Diabetes Neg Hx    Colon cancer Neg Hx    Esophageal cancer Neg Hx    Rectal cancer Neg Hx    Stomach cancer Neg Hx      Social History Social History   Tobacco Use   Smoking status: Never   Smokeless tobacco: Never  Vaping Use   Vaping status: Never Used  Substance Use Topics   Alcohol use: Yes    Comment: occ   Drug use: No     Allergies   Patient has no known allergies.   Review of Systems Review of Systems   Physical Exam Triage Vital Signs ED Triage Vitals  Encounter Vitals Group     BP 05/20/24 1206 117/72     Systolic BP Percentile --      Diastolic BP Percentile --      Pulse Rate 05/20/24 1206 61     Resp 05/20/24 1206 16     Temp 05/20/24 1206 98.2 F (36.8 C)     Temp Source 05/20/24 1206 Oral     SpO2 05/20/24 1206 97 %     Weight --      Height --      Head Circumference --      Peak Flow --      Pain Score 05/20/24 1204 4     Pain  Loc --      Pain Education --      Exclude from Growth Chart --    No data found.  Updated Vital Signs BP 117/72 (BP Location: Left Arm)   Pulse 61   Temp 98.2 F (36.8 C) (Oral)   Resp 16   SpO2 97%   Visual Acuity Right Eye Distance:   Left Eye Distance:   Bilateral Distance:    Right Eye Near:   Left Eye Near:    Bilateral Near:     Physical Exam Vitals reviewed.  Constitutional:      General: He is not in acute distress.    Appearance: He is not ill-appearing, toxic-appearing or diaphoretic.     Comments: No obvious distress, sitting comfortably in the chair in the exam room.  Cardiovascular:     Rate and Rhythm: Normal rate and regular rhythm.  Pulmonary:     Effort: Pulmonary effort is normal.     Breath sounds: Normal breath sounds.  Musculoskeletal:     Left lower leg: No edema.     Comments: There is no calf tenderness, and there are normal pulses in the dorsalis pedis area.  Skin:    Coloration: Skin is not jaundiced or pale.  Neurological:     General: No focal deficit present.     Mental Status: He is alert and oriented to person, place, and time.  Psychiatric:        Behavior: Behavior normal.       UC Treatments / Results  Labs (all labs ordered are listed, but only abnormal results are displayed) Labs Reviewed  POCT FASTING [facility] MANUAL ENTRY - Abnormal; Notable for the following components:      Result Value   POCT Glucose ([facility]) 190 (*)    All other components within normal limits  BASIC METABOLIC PANEL WITH GFR  TSH  VITAMIN B12    EKG   Radiology No results found.  Procedures Procedures (including critical care time)  Medications Ordered in UC Medications - No data to display  Initial Impression / Assessment and Plan / UC Course  I have reviewed the triage vital signs and the nursing notes.  Pertinent labs & imaging results that were available during my care of the patient were reviewed by me and considered in my medical decision making (see chart for details).     Fingerstick glucose is 190, just ate a bag of chips in the last hour  This could be the new onset diabetes or it could be still prediabetes, since he just had a bunch of carbs within the last hour.  BMP, TSH, and B12 are drawn, and staff will notify him if anything significantly abnormal.  Gabapentin is sent in for the short-term for him to try to see if it will help his pain at all.  He understands about the possible side effects, and it is intended initially for nighttime only use.  I have asked him to please make a follow-up appointment with his primary care, and he states he will do so.  He is given contact information for neurology, but I suspect with his insurance coverage he will need an authorization from his primary care to see a specialist. Final Clinical Impressions(s) / UC Diagnoses   Final diagnoses:  Pain of left lower leg  Paresthesia     Discharge Instructions      Your sugar was 190.  Since this was within an hour of you eating some  chips, it most likely means that you still only have prediabetes and not diabetes.  Still, your primary care can do more test to sort  that out. We have drawn blood to check electrolytes and kidney function and thyroid  function and B12.  Our staff will notify you if anything is significantly abnormal.  Begin taking gabapentin 100 mg--take 1 capsule nightly for a few nights.  If it is not helping your symptoms and helping you rest, then go up to 2 capsules every night.  Again in a few nights if still not helping much at all, increase to 3 capsules nightly.  This medicine can potentially cause hangover in the morning and dizziness and sleepiness.   Please make an appointment with your primary care to follow-up this issue and further evaluate.  If you have Medicaid for your medical coverage, you most likely will need an authorization from them to go see a specialist, such as a neurologist.    ED Prescriptions     Medication Sig Dispense Auth. Provider   gabapentin (NEURONTIN) 100 MG capsule Take 1-3 capsules (100-300 mg total) by mouth at bedtime. 90 capsule Lavarr President, Paige Boatman, MD      PDMP not reviewed this encounter.   Ann Keto, MD 05/20/24 [phone]

## 2024-05-20 NOTE — ED Triage Notes (Signed)
 Patient presenting with left foot burning pain onset 2 weeks ago. No known falls or injuries. Was seen earlier today at another urgent care, states was informed they had no way to check his foot. States unable to stand for long periods of time.  Prescriptions or OTC medications tried: No

## 2024-05-21 ENCOUNTER — Telehealth: Payer: Self-pay | Admitting: Nurse Practitioner

## 2024-05-21 NOTE — Telephone Encounter (Signed)
Patient identified by name and date of birth.  Appointment scheduled.  

## 2024-05-21 NOTE — Telephone Encounter (Signed)
 Copied from CRM [id number]. Topic: Referral - Request for Referral >> May 21, 2024  2:26 PM [employer] wrote:  Did the patient discuss referral with their provider in the last year? Yes (If No - schedule appointment) (If Yes - send message)  Appointment offered? No  Type of order/referral and detailed reason for visit:  podiatrist  Preference of office, provider, location: no Preference   If referral order, have you been seen by this specialty before? No (If Yes, this issue or another issue? When? Where?  Can we respond through MyChart? No. Please Call

## 2024-06-02 ENCOUNTER — Ambulatory Visit: Admitting: Nurse Practitioner
